# Patient Record
Sex: Female | Born: 1965 | Race: White | Hispanic: No | Marital: Single | Smoking: Never smoker
Health system: Southern US, Community
[De-identification: ages and names within clinical notes are randomized; demographics above are authoritative.]

## PROBLEM LIST (undated history)

## (undated) DIAGNOSIS — C801 Malignant (primary) neoplasm, unspecified: Secondary | ICD-10-CM

## (undated) DIAGNOSIS — F988 Other specified behavioral and emotional disorders with onset usually occurring in childhood and adolescence: Secondary | ICD-10-CM

## (undated) DIAGNOSIS — F419 Anxiety disorder, unspecified: Secondary | ICD-10-CM

## (undated) HISTORY — PX: VEIN LIGATION AND STRIPPING: SHX2653

---

## 2017-03-07 ENCOUNTER — Encounter (HOSPITAL_BASED_OUTPATIENT_CLINIC_OR_DEPARTMENT_OTHER): Payer: Self-pay | Admitting: Emergency Medicine

## 2017-03-07 ENCOUNTER — Emergency Department (HOSPITAL_BASED_OUTPATIENT_CLINIC_OR_DEPARTMENT_OTHER)
Admission: EM | Admit: 2017-03-07 | Discharge: 2017-03-07 | Disposition: A | Discharge status: Home / Self Care | Payer: Medicaid Other | Attending: Emergency Medicine | Admitting: Emergency Medicine

## 2017-03-07 ENCOUNTER — Other Ambulatory Visit: Payer: Self-pay

## 2017-03-07 DIAGNOSIS — Z79899 Other long term (current) drug therapy: Secondary | ICD-10-CM | POA: Diagnosis not present

## 2017-03-07 DIAGNOSIS — R350 Frequency of micturition: Secondary | ICD-10-CM | POA: Insufficient documentation

## 2017-03-07 DIAGNOSIS — R102 Pelvic and perineal pain: Secondary | ICD-10-CM | POA: Diagnosis present

## 2017-03-07 HISTORY — DX: Other specified behavioral and emotional disorders with onset usually occurring in childhood and adolescence: F98.8

## 2017-03-07 HISTORY — DX: Anxiety disorder, unspecified: F41.9

## 2017-03-07 LAB — WET PREP, GENITAL
SPERM: NONE SEEN
TRICH WET PREP: NONE SEEN
Yeast Wet Prep HPF POC: NONE SEEN

## 2017-03-07 LAB — URINALYSIS, MICROSCOPIC (REFLEX)

## 2017-03-07 LAB — URINALYSIS, ROUTINE W REFLEX MICROSCOPIC
Bilirubin Urine: NEGATIVE
GLUCOSE, UA: NEGATIVE mg/dL
KETONES UR: NEGATIVE mg/dL
Nitrite: NEGATIVE
PROTEIN: NEGATIVE mg/dL
Specific Gravity, Urine: 1.02 (ref 1.005–1.030)
pH: 7 (ref 5.0–8.0)

## 2017-03-07 MED ORDER — NITROFURANTOIN MONOHYD MACRO 100 MG PO CAPS: 100.0000 mg | ORAL_CAPSULE | Freq: Two times a day (BID) | ORAL | 0 refills | Status: DC

## 2017-03-07 MED FILL — NITROFURANTOIN MONO-MCR 100: 100 | 5 days supply | Qty: 10 | Fill #0

## 2017-03-07 NOTE — ED Triage Notes (Signed)
Patient states that she woke up this am with pain to her right lower pelvic region, the patient reports that the pain comes and goes and increases with movement. Reports that she has had been nauseous this am

## 2017-03-07 NOTE — ED Notes (Signed)
Urine sample and culture obtained and sent to lab.  

## 2017-03-07 NOTE — ED Notes (Signed)
Pelvic cart at bedside. 

## 2017-03-07 NOTE — ED Provider Notes (Signed)
[facility] EMERGENC[facility] DEPARTMENT Provider Note   CSN: [id number] Arrival date & time: 03/07/17  1043     History   Chief Complaint Chief Complaint  Patient presents with  . Pelvic Pain    HPI Diana Woods is a 52 y.o. female here for RLQ abdominal pain described sharp and stabbing that radiated to right low back associated with nausea starting at mid morning. Sitting walking standing made it worse, laying on right side made it feel better. Took gassex which caused resolution of pain. Pt feels like it may have been gas, she is belching and passing gas which is making her feel better.  Currently no pain. Also endorses urinary frequency and bladder fullness, but no frank dysuria. She thinks she may have a UTI. LMP January 20th. No h/o STDs or PID. she is sexually active without condom use. Not concerned about STDs today. No fevers, chills, vomiting, CP, SOB, rash, abnormal vaginal discharge or bleeding, diarrhea, melena, hematochezia.  Has h/o intermittent constipation, unchanged. Last BM yesterday. H/o c-section but no other abdominal surgeries.   HPI  Past Medical History:  Diagnosis Date  . ADD (attention deficit disorder)   . Anxiety     There are no active problems to display for this patient.   History reviewed. No pertinent surgical history.  OB History    No data available       Home Medications    Prior to Admission medications   Medication Sig Start Date End Date Taking? Authorizing Provider  amphetamine-dextroamphetamine (ADDERALL XR) 30 MG 24 hr capsule Take 30 mg by mouth daily.   Yes [provider]  sertraline (ZOLOFT) 100 MG tablet Take 100 mg by mouth daily.   Yes [provider]  nitrofurantoin, macrocrystal-monohydrate, (MACROBID) 100 MG capsule Take 1 capsule (100 mg total) by mouth 2 (two) times daily. 03/07/17   Kinnie Feil, PA-C    Family History History reviewed. No pertinent family history.  Social  History Social History   Tobacco Use  . Smoking status: Never Smoker  . Smokeless tobacco: Never Used  Substance Use Topics  . Alcohol use: No    Frequency: Never  . Drug use: No     Allergies   Patient has no known allergies.   Review of Systems Review of Systems  Gastrointestinal: Positive for abdominal pain (resolved) and nausea (resolved).  Genitourinary: Positive for frequency.       +bladder fullness     Physical Exam Updated Vital Signs BP (!) 142/96 (BP Location: Right Arm)   Pulse 80   Temp 99 F (37.2 C) (Oral)   Resp 18   Ht 5\' 6"  (1.676 m)   Wt 126.1 kg (278 lb)   LMP 02/12/2017   SpO2 100%   BMI 44.87 kg/m   Physical Exam  Constitutional: She is oriented to person, place, and time. She appears well-developed and well-nourished. No distress.  NAD.  HENT:  Head: Normocephalic and atraumatic.  Right Ear: External ear normal.  Left Ear: External ear normal.  Nose: Nose normal.  Eyes: Conjunctivae and EOM are normal. No scleral icterus.  Neck: Normal range of motion. Neck supple.  Cardiovascular: Normal rate, regular rhythm and normal heart sounds.  No murmur heard. Pulmonary/Chest: Effort normal and breath sounds normal. She has no wheezes.  Abdominal: Soft. There is no tenderness.  No guarding, rigidity, rebound. Negative Murphy's and McBurney's. No suprapubic or CVA tenderness.  Genitourinary:  Genitourinary Comments: External genitalia normal without erythema,  edema, tenderness, discharge or lesions.  No groin lymphadenopathy.  Vaginal mucosa and cervix normal, pink without discharge or lesions.  Uterus in midline, smooth, not enlarged or tender. No CMT. Non palpable adnexa. EMT and student chaperone during exam.   Musculoskeletal: Normal range of motion. She exhibits no deformity.  Neurological: She is alert and oriented to person, place, and time.  Skin: Skin is warm and dry. Capillary refill takes less than 2 seconds.  Psychiatric: She has  a normal mood and affect. Her behavior is normal. Judgment and thought content normal.  Nursing note and vitals reviewed.    ED Treatments / Results  Labs (all labs ordered are listed, but only abnormal results are displayed) Labs Reviewed  WET PREP, GENITAL - Abnormal; Notable for the following components:      Result Value   Clue Cells Wet Prep HPF POC PRESENT (*)    WBC, Wet Prep HPF POC MANY (*)    All other components within normal limits  URINALYSIS, ROUTINE W REFLEX MICROSCOPIC - Abnormal; Notable for the following components:   Hgb urine dipstick LARGE (*)    Leukocytes, UA TRACE (*)    All other components within normal limits  URINALYSIS, MICROSCOPIC (REFLEX) - Abnormal; Notable for the following components:   Bacteria, UA RARE (*)    Squamous Epithelial / LPF 0-5 (*)    All other components within normal limits  GC/CHLAMYDIA PROBE AMP ([place]) NOT AT [facility]    EKG  EKG Interpretation None       Radiology No results found.  Procedures Procedures (including critical care time)  Medications Ordered in ED Medications - No data to display   Initial Impression / Assessment and Plan / ED Course  I have reviewed the triage vital signs and the nursing notes.  Pertinent labs & imaging results that were available during my care of the patient were reviewed by me and considered in my medical decision making (see chart for details).    52 year old female here for right lower quadrant abdominal pain that radiated to the right lower back associated with nausea. Upon arrival her symptoms resolved. She had taken Gas-X PTA and had increased belching and passing gas, with resolution of symptoms. My exam is benign. No abdominal tenderness, CVA tenderness with normal pelvic exam. U/A obtained at triage with large hemoglobin and trace leukocytes.  She does have urinary frequency and bladder fullness and feels like she has a UTI. Given description of pain radiating to right low  back and hemoglobin in her urine, I recommended CT renal study to rule out kidney stone or recently passed kidney stone but she declined. She also declined lab work or CT A/P to rule out appendicitis. I think this is reasonable as she is asymptomatic currently, exam and VS WNL. She will be discharged with macrobid, pending urine culture. Advised she should take antibiotic is she is notified of positive urine culture. She declined empiric STD testing today and further work up. Gave strict ED return precautions. She is to return if RLQ pain returns or localizes, nausea, vomiting, fevers. Pt verbalized understanding and agreeable.,   Final Clinical Impressions(s) / ED Diagnoses   Final diagnoses:  Urinary frequency    ED Discharge Orders        Ordered    nitrofurantoin, macrocrystal-monohydrate, (MACROBID) 100 MG capsule  2 times daily     03/07/17 1430       Kinnie Feil, [place] 03/07/17 1438    Regenia Skeeter,  Nicki Reaper, MD 03/07/17 1556

## 2017-03-07 NOTE — Discharge Instructions (Signed)
Your urine had some hemoglobin (blood in it). As we discussed this may have been from a recently passed stone or early urinary tract infection. You declined CT imaging today to rule out kidney stone, kidney infection and appendicitis because your pain had resolved.   You have a prescription for antibiotics for early urinary tract infection. Even though your urine did not look infected today, it was sent for culture to rule out an early infection. You will be called in 1-2 days if it is positive, if it is you should start taking the antibiotics. Your gonorrhea and chlamydia testing is pending, again, you will be called in 2 days if these are positive.   Return if you pain return, you have nausea, vomiting, fevers, chills, abnormal vaginal discharge and bleeding.

## 2017-03-08 LAB — GC/CHLAMYDIA PROBE AMP (~~LOC~~) NOT AT ARMC
Chlamydia: NEGATIVE
NEISSERIA GONORRHEA: NEGATIVE

## 2018-04-22 ENCOUNTER — Other Ambulatory Visit: Payer: Self-pay

## 2018-04-22 ENCOUNTER — Encounter (HOSPITAL_BASED_OUTPATIENT_CLINIC_OR_DEPARTMENT_OTHER): Payer: Self-pay | Admitting: *Deleted

## 2018-04-22 ENCOUNTER — Observation Stay (HOSPITAL_COMMUNITY): Payer: Medicaid Other | Admitting: Anesthesiology

## 2018-04-22 ENCOUNTER — Emergency Department (HOSPITAL_BASED_OUTPATIENT_CLINIC_OR_DEPARTMENT_OTHER): Payer: Medicaid Other

## 2018-04-22 ENCOUNTER — Ambulatory Visit (HOSPITAL_BASED_OUTPATIENT_CLINIC_OR_DEPARTMENT_OTHER)
Admission: EM | Admit: 2018-04-22 | Discharge: 2018-04-23 | Disposition: A | Discharge status: Home / Self Care | Payer: Medicaid Other | Attending: General Surgery | Admitting: General Surgery

## 2018-04-22 ENCOUNTER — Encounter (HOSPITAL_COMMUNITY)
Admission: EM | Disposition: A | Discharge status: Home / Self Care | Payer: Self-pay | Source: Home / Self Care | Attending: Emergency Medicine

## 2018-04-22 DIAGNOSIS — Z79899 Other long term (current) drug therapy: Secondary | ICD-10-CM | POA: Insufficient documentation

## 2018-04-22 DIAGNOSIS — K3580 Unspecified acute appendicitis: Secondary | ICD-10-CM

## 2018-04-22 DIAGNOSIS — K37 Unspecified appendicitis: Secondary | ICD-10-CM | POA: Diagnosis present

## 2018-04-22 DIAGNOSIS — F988 Other specified behavioral and emotional disorders with onset usually occurring in childhood and adolescence: Secondary | ICD-10-CM | POA: Diagnosis not present

## 2018-04-22 DIAGNOSIS — F419 Anxiety disorder, unspecified: Secondary | ICD-10-CM | POA: Diagnosis not present

## 2018-04-22 DIAGNOSIS — K3533 Acute appendicitis with perforation and localized peritonitis, with abscess: Secondary | ICD-10-CM | POA: Insufficient documentation

## 2018-04-22 DIAGNOSIS — Z6841 Body Mass Index (BMI) 40.0 and over, adult: Secondary | ICD-10-CM | POA: Diagnosis not present

## 2018-04-22 HISTORY — PX: LAPAROSCOPIC APPENDECTOMY: SHX408

## 2018-04-22 HISTORY — DX: Malignant (primary) neoplasm, unspecified: C80.1

## 2018-04-22 LAB — CBC WITH DIFFERENTIAL/PLATELET
ABS IMMATURE GRANULOCYTES: 0.03 10*3/uL (ref 0.00–0.07)
BASOS ABS: 0 10*3/uL (ref 0.0–0.1)
Basophils Relative: 0 %
Eosinophils Absolute: 0 10*3/uL (ref 0.0–0.5)
Eosinophils Relative: 0 %
HCT: 40.8 % (ref 36.0–46.0)
HEMOGLOBIN: 13.2 g/dL (ref 12.0–15.0)
Immature Granulocytes: 0 %
LYMPHS ABS: 1.3 10*3/uL (ref 0.7–4.0)
Lymphocytes Relative: 11 %
MCH: 28.9 pg (ref 26.0–34.0)
MCHC: 32.4 g/dL (ref 30.0–36.0)
MCV: 89.3 fL (ref 80.0–100.0)
Monocytes Absolute: 0.4 10*3/uL (ref 0.1–1.0)
Monocytes Relative: 4 %
NEUTROS PCT: 85 %
Neutro Abs: 9.8 10*3/uL — ABNORMAL HIGH (ref 1.7–7.7)
Platelets: 263 10*3/uL (ref 150–400)
RBC: 4.57 MIL/uL (ref 3.87–5.11)
RDW: 12.8 % (ref 11.5–15.5)
WBC: 11.5 10*3/uL — ABNORMAL HIGH (ref 4.0–10.5)
nRBC: 0 % (ref 0.0–0.2)

## 2018-04-22 LAB — COMPREHENSIVE METABOLIC PANEL
ALBUMIN: 4 g/dL (ref 3.5–5.0)
ALK PHOS: 72 U/L (ref 38–126)
ALT: 16 U/L (ref 0–44)
ANION GAP: 8 (ref 5–15)
AST: 19 U/L (ref 15–41)
BUN: 12 mg/dL (ref 6–20)
CALCIUM: 9.4 mg/dL (ref 8.9–10.3)
CO2: 27 mmol/L (ref 22–32)
Chloride: 101 mmol/L (ref 98–111)
Creatinine, Ser: 0.74 mg/dL (ref 0.44–1.00)
GFR calc Af Amer: >60 mL/min (ref >60)
GFR calc non Af Amer: >60 mL/min (ref >60)
GLUCOSE: 139 mg/dL — AB (ref 70–99)
Potassium: 4.4 mmol/L (ref 3.5–5.1)
SODIUM: 136 mmol/L (ref 135–145)
Total Bilirubin: 0.9 mg/dL (ref 0.3–1.2)
Total Protein: 7.8 g/dL (ref 6.5–8.1)

## 2018-04-22 LAB — SURGICAL PCR SCREEN
MRSA, PCR: NEGATIVE
STAPHYLOCOCCUS AUREUS: NEGATIVE

## 2018-04-22 SURGERY — APPENDECTOMY, LAPAROSCOPIC
Anesthesia: General | Site: Abdomen

## 2018-04-22 MED ORDER — EPHEDRINE 5 MG/ML INJ
INTRAVENOUS | Status: AC
  Filled 2018-04-22: qty 10

## 2018-04-22 MED ORDER — BUPIVACAINE-EPINEPHRINE (PF) 0.25% -1:200000 IJ SOLN
INTRAMUSCULAR | Status: AC
  Filled 2018-04-22: qty 30

## 2018-04-22 MED ORDER — HEPARIN SODIUM (PORCINE) 5000 UNIT/ML IJ SOLN
5000.0000 [IU] | Freq: Three times a day (TID) | INTRAMUSCULAR | Status: DC
  Administered 2018-04-23: 5000 [IU] via SUBCUTANEOUS
  Filled 2018-04-22: qty 1

## 2018-04-22 MED ORDER — MORPHINE SULFATE (PF) 4 MG/ML IV SOLN
4.0000 mg | Freq: Once | INTRAVENOUS | Status: AC
  Administered 2018-04-22: 4 mg via INTRAVENOUS
  Filled 2018-04-22: qty 1

## 2018-04-22 MED ORDER — FENTANYL CITRATE (PF) 100 MCG/2ML IJ SOLN
INTRAMUSCULAR | Status: DC | PRN
  Administered 2018-04-22: 25 ug via INTRAVENOUS
  Administered 2018-04-22 (×2): 50 ug via INTRAVENOUS
  Administered 2018-04-22: 25 ug via INTRAVENOUS
  Administered 2018-04-22 (×3): 50 ug via INTRAVENOUS
  Administered 2018-04-22 (×2): 25 ug via INTRAVENOUS

## 2018-04-22 MED ORDER — 0.9 % SODIUM CHLORIDE (POUR BTL) OPTIME
TOPICAL | Status: DC | PRN
  Administered 2018-04-22: 1000 mL

## 2018-04-22 MED ORDER — MIDAZOLAM HCL 2 MG/2ML IJ SOLN
INTRAMUSCULAR | Status: AC
  Filled 2018-04-22: qty 2

## 2018-04-22 MED ORDER — IOHEXOL 300 MG/ML  SOLN
100.0000 mL | Freq: Once | INTRAMUSCULAR | Status: AC | PRN
  Administered 2018-04-22: 100 mL via INTRAVENOUS

## 2018-04-22 MED ORDER — ONDANSETRON HCL 4 MG/2ML IJ SOLN
INTRAMUSCULAR | Status: DC | PRN
  Administered 2018-04-22: 4 mg via INTRAVENOUS

## 2018-04-22 MED ORDER — LACTATED RINGERS IV SOLN
INTRAVENOUS | Status: DC | PRN
  Administered 2018-04-22 (×2): via INTRAVENOUS

## 2018-04-22 MED ORDER — SUCCINYLCHOLINE CHLORIDE 20 MG/ML IJ SOLN
INTRAMUSCULAR | Status: DC | PRN
  Administered 2018-04-22: 120 mg via INTRAVENOUS

## 2018-04-22 MED ORDER — AMPHETAMINE-DEXTROAMPHET ER 10 MG PO CP24
30.0000 mg | ORAL_CAPSULE | Freq: Every day | ORAL | Status: DC
  Administered 2018-04-23: 30 mg via ORAL
  Filled 2018-04-22: qty 3

## 2018-04-22 MED ORDER — EPHEDRINE SULFATE 50 MG/ML IJ SOLN
INTRAMUSCULAR | Status: DC | PRN
  Administered 2018-04-22: 15 mg via INTRAVENOUS
  Administered 2018-04-22 (×2): 10 mg via INTRAVENOUS

## 2018-04-22 MED ORDER — ONDANSETRON HCL 4 MG/2ML IJ SOLN
INTRAMUSCULAR | Status: AC
  Filled 2018-04-22: qty 2

## 2018-04-22 MED ORDER — BUPIVACAINE-EPINEPHRINE 0.25% -1:200000 IJ SOLN
INTRAMUSCULAR | Status: DC | PRN
  Administered 2018-04-22: 18 mL

## 2018-04-22 MED ORDER — LIDOCAINE 2% (20 MG/ML) 5 ML SYRINGE
INTRAMUSCULAR | Status: AC
  Filled 2018-04-22: qty 5

## 2018-04-22 MED ORDER — SODIUM CHLORIDE 0.9 % IV SOLN
INTRAVENOUS | Status: AC
  Administered 2018-04-22: 12:00:00 via INTRAVENOUS

## 2018-04-22 MED ORDER — PHENYLEPHRINE HCL 10 MG/ML IJ SOLN
INTRAMUSCULAR | Status: DC | PRN
  Administered 2018-04-22: 120 ug via INTRAVENOUS

## 2018-04-22 MED ORDER — ROCURONIUM BROMIDE 100 MG/10ML IV SOLN
INTRAVENOUS | Status: DC | PRN
  Administered 2018-04-22: 25 mg via INTRAVENOUS
  Administered 2018-04-22: 5 mg via INTRAVENOUS
  Administered 2018-04-22: 25 mg via INTRAVENOUS

## 2018-04-22 MED ORDER — SUGAMMADEX SODIUM 200 MG/2ML IV SOLN
INTRAVENOUS | Status: DC | PRN
  Administered 2018-04-22: 300 mg via INTRAVENOUS

## 2018-04-22 MED ORDER — ALBUMIN HUMAN 5 % IV SOLN
INTRAVENOUS | Status: AC
  Filled 2018-04-22: qty 250

## 2018-04-22 MED ORDER — FENTANYL CITRATE (PF) 250 MCG/5ML IJ SOLN
INTRAMUSCULAR | Status: AC
  Filled 2018-04-22: qty 5

## 2018-04-22 MED ORDER — HYDROCODONE-ACETAMINOPHEN 5-325 MG PO TABS
1.0000 | ORAL_TABLET | ORAL | Status: DC | PRN
  Administered 2018-04-23 (×2): 2 via ORAL
  Filled 2018-04-22 (×2): qty 2

## 2018-04-22 MED ORDER — GLYCOPYRROLATE 0.2 MG/ML IJ SOLN
INTRAMUSCULAR | Status: DC | PRN
  Administered 2018-04-22: 0.4 mg via INTRAVENOUS

## 2018-04-22 MED ORDER — ONDANSETRON HCL 4 MG/2ML IJ SOLN: 4.0000 mg | Freq: Four times a day (QID) | INTRAMUSCULAR | Status: DC | PRN

## 2018-04-22 MED ORDER — DEXAMETHASONE SODIUM PHOSPHATE 10 MG/ML IJ SOLN
INTRAMUSCULAR | Status: DC | PRN
  Administered 2018-04-22: 5 mg via INTRAVENOUS

## 2018-04-22 MED ORDER — PHENYLEPHRINE 40 MCG/ML (10ML) SYRINGE FOR IV PUSH (FOR BLOOD PRESSURE SUPPORT)
PREFILLED_SYRINGE | INTRAVENOUS | Status: AC
  Filled 2018-04-22: qty 10

## 2018-04-22 MED ORDER — ONDANSETRON HCL 4 MG/2ML IJ SOLN
4.0000 mg | Freq: Once | INTRAMUSCULAR | Status: DC
  Filled 2018-04-22: qty 2

## 2018-04-22 MED ORDER — FAMOTIDINE 20 MG PO TABS
20.0000 mg | ORAL_TABLET | Freq: Once | ORAL | Status: AC
  Administered 2018-04-22: 20 mg via ORAL
  Filled 2018-04-22: qty 1

## 2018-04-22 MED ORDER — PROPOFOL 10 MG/ML IV BOLUS
INTRAVENOUS | Status: AC
  Filled 2018-04-22: qty 20

## 2018-04-22 MED ORDER — MORPHINE SULFATE (PF) 4 MG/ML IV SOLN
4.0000 mg | INTRAVENOUS | Status: DC | PRN
Start: 2018-04-22 — End: 2018-04-22

## 2018-04-22 MED ORDER — ONDANSETRON HCL 4 MG/2ML IJ SOLN: 4.0000 mg | Freq: Once | INTRAMUSCULAR | Status: DC | PRN

## 2018-04-22 MED ORDER — MORPHINE SULFATE (PF) 2 MG/ML IV SOLN
1.0000 mg | INTRAVENOUS | Status: DC | PRN
  Administered 2018-04-22: 1 mg via INTRAVENOUS
  Filled 2018-04-22: qty 1

## 2018-04-22 MED ORDER — FENTANYL CITRATE (PF) 100 MCG/2ML IJ SOLN: 25.0000 ug | INTRAMUSCULAR | Status: DC | PRN

## 2018-04-22 MED ORDER — METRONIDAZOLE IN NACL 5-0.79 MG/ML-% IV SOLN
500.0000 mg | Freq: Once | INTRAVENOUS | Status: AC
  Administered 2018-04-22: 500 mg via INTRAVENOUS
  Filled 2018-04-22: qty 100

## 2018-04-22 MED ORDER — FENTANYL CITRATE (PF) 100 MCG/2ML IJ SOLN
INTRAMUSCULAR | Status: AC
  Filled 2018-04-22: qty 2

## 2018-04-22 MED ORDER — DEXAMETHASONE SODIUM PHOSPHATE 10 MG/ML IJ SOLN
INTRAMUSCULAR | Status: AC
  Filled 2018-04-22: qty 1

## 2018-04-22 MED ORDER — MIDAZOLAM HCL 5 MG/5ML IJ SOLN
INTRAMUSCULAR | Status: DC | PRN
  Administered 2018-04-22: 2 mg via INTRAVENOUS

## 2018-04-22 MED ORDER — LIDOCAINE HCL (CARDIAC) PF 100 MG/5ML IV SOSY
PREFILLED_SYRINGE | INTRAVENOUS | Status: DC | PRN
  Administered 2018-04-22: 50 mg via INTRAVENOUS

## 2018-04-22 MED ORDER — PANTOPRAZOLE SODIUM 40 MG IV SOLR
40.0000 mg | Freq: Every day | INTRAVENOUS | Status: DC
  Administered 2018-04-22: 40 mg via INTRAVENOUS
  Filled 2018-04-22: qty 40

## 2018-04-22 MED ORDER — GLYCOPYRROLATE PF 0.2 MG/ML IJ SOSY
PREFILLED_SYRINGE | INTRAMUSCULAR | Status: AC
  Filled 2018-04-22: qty 2

## 2018-04-22 MED ORDER — ROCURONIUM BROMIDE 100 MG/10ML IV SOLN
INTRAVENOUS | Status: AC
  Filled 2018-04-22: qty 1

## 2018-04-22 MED ORDER — ETOMIDATE 2 MG/ML IV SOLN
INTRAVENOUS | Status: AC
  Filled 2018-04-22: qty 10

## 2018-04-22 MED ORDER — ONDANSETRON 4 MG PO TBDP
4.0000 mg | ORAL_TABLET | Freq: Once | ORAL | Status: AC
  Administered 2018-04-22: 4 mg via ORAL
  Filled 2018-04-22: qty 1

## 2018-04-22 MED ORDER — LACTATED RINGERS IR SOLN
Status: DC | PRN
  Administered 2018-04-22: 1000 mL

## 2018-04-22 MED ORDER — VASOPRESSIN 20 UNIT/ML IV SOLN
INTRAVENOUS | Status: AC
  Filled 2018-04-22: qty 1

## 2018-04-22 MED ORDER — SERTRALINE HCL 100 MG PO TABS
100.0000 mg | ORAL_TABLET | Freq: Every day | ORAL | Status: DC
  Administered 2018-04-23: 100 mg via ORAL
  Filled 2018-04-22: qty 1

## 2018-04-22 MED ORDER — ONDANSETRON 4 MG PO TBDP: 4.0000 mg | ORAL_TABLET | Freq: Four times a day (QID) | ORAL | Status: DC | PRN

## 2018-04-22 MED ORDER — KCL IN DEXTROSE-NACL 20-5-0.9 MEQ/L-%-% IV SOLN
INTRAVENOUS | Status: DC
  Administered 2018-04-22: 18:00:00 via INTRAVENOUS
  Filled 2018-04-22 (×2): qty 1000

## 2018-04-22 MED ORDER — SUCCINYLCHOLINE CHLORIDE 200 MG/10ML IV SOSY
PREFILLED_SYRINGE | INTRAVENOUS | Status: AC
  Filled 2018-04-22: qty 10

## 2018-04-22 MED ORDER — MORPHINE SULFATE (PF) 4 MG/ML IV SOLN
4.0000 mg | INTRAVENOUS | Status: DC | PRN
  Administered 2018-04-22: 4 mg via INTRAVENOUS
  Filled 2018-04-22: qty 1

## 2018-04-22 MED ORDER — CEFTRIAXONE SODIUM 2 G IJ SOLR
2.0000 g | Freq: Once | INTRAMUSCULAR | Status: AC
  Administered 2018-04-22: 2 g via INTRAVENOUS
  Filled 2018-04-22: qty 20

## 2018-04-22 MED ORDER — PROPOFOL 10 MG/ML IV BOLUS
INTRAVENOUS | Status: DC | PRN
  Administered 2018-04-22: 180 mg via INTRAVENOUS

## 2018-04-22 SURGICAL SUPPLY — 31 items
APPLIER CLIP ROT 10 11.4 M/L (STAPLE)
CABLE HIGH FREQUENCY MONO STRZ (ELECTRODE) ×3 IMPLANT
CHLORAPREP W/TINT 26 (MISCELLANEOUS) ×3 IMPLANT
CLIP APPLIE ROT 10 11.4 M/L (STAPLE) IMPLANT
COVER WAND RF STERILE (DRAPES) IMPLANT
CUTTER FLEX LINEAR 45M (STAPLE) ×3 IMPLANT
DECANTER SPIKE VIAL GLASS SM (MISCELLANEOUS) ×3 IMPLANT
DERMABOND ADVANCED (GAUZE/BANDAGES/DRESSINGS) ×2
DERMABOND ADVANCED .7 DNX12 (GAUZE/BANDAGES/DRESSINGS) ×1 IMPLANT
ELECT REM PT RETURN 15FT ADLT (MISCELLANEOUS) ×3 IMPLANT
ENDOLOOP SUT PDS II  0 18 (SUTURE)
ENDOLOOP SUT PDS II 0 18 (SUTURE) IMPLANT
GLOVE BIO SURGEON STRL SZ7.5 (GLOVE) ×3 IMPLANT
GOWN STRL REUS W/TWL XL LVL3 (GOWN DISPOSABLE) ×6 IMPLANT
KIT BASIN OR (CUSTOM PROCEDURE TRAY) ×3 IMPLANT
KIT TURNOVER KIT A (KITS) IMPLANT
POUCH RETRIEVAL ECOSAC 10 (ENDOMECHANICALS) ×1 IMPLANT
POUCH RETRIEVAL ECOSAC 10MM (ENDOMECHANICALS) ×2
POUCH SPECIMEN RETRIEVAL 10MM (ENDOMECHANICALS) IMPLANT
RELOAD 45 VASCULAR/THIN (ENDOMECHANICALS) IMPLANT
RELOAD STAPLE TA45 3.5 REG BLU (ENDOMECHANICALS) ×3 IMPLANT
SCISSORS LAP 5X35 DISP (ENDOMECHANICALS) ×3 IMPLANT
SET IRRIG TUBING LAPAROSCOPIC (IRRIGATION / IRRIGATOR) ×3 IMPLANT
SET TUBE SMOKE EVAC HIGH FLOW (TUBING) ×3 IMPLANT
SHEARS HARMONIC ACE PLUS 36CM (ENDOMECHANICALS) ×3 IMPLANT
SUT MNCRL AB 4-0 PS2 18 (SUTURE) ×3 IMPLANT
TOWEL OR 17X26 10 PK STRL BLUE (TOWEL DISPOSABLE) ×3 IMPLANT
TRAY FOLEY MTR SLVR 16FR STAT (SET/KITS/TRAYS/PACK) IMPLANT
TRAY LAPAROSCOPIC (CUSTOM PROCEDURE TRAY) ×3 IMPLANT
TROCAR BLADELESS OPT 5 100 (ENDOMECHANICALS) ×3 IMPLANT
TROCAR XCEL BLUNT TIP 100MML (ENDOMECHANICALS) ×3 IMPLANT

## 2018-04-22 NOTE — Op Note (Signed)
04/22/2018  4:41 PM  PATIENT:  Diana Woods  53 y.o. female  PRE-OPERATIVE DIAGNOSIS:  appendicits  POST-OPERATIVE DIAGNOSIS:  appendicits  PROCEDURE:  Procedure(s): APPENDECTOMY LAPAROSCOPIC (N/A)  SURGEON:  Surgeon(s) and Role:    * Jovita Kussmaul, MD - Primary  PHYSICIAN ASSISTANT:   ASSISTANTS: none   ANESTHESIA:   local and general  EBL:  10 mL   BLOOD ADMINISTERED:none  DRAINS: none   LOCAL MEDICATIONS USED:  MARCAINE     SPECIMEN:  Source of Specimen:  appendix  DISPOSITION OF SPECIMEN:  PATHOLOGY  COUNTS:  YES  TOURNIQUET:  * No tourniquets in log *  DICTATION: .Dragon Dictation   After informed consent was obtained patient was brought to the operating room placed in the supine position on the operating room table. After adequate induction of general anesthesia the patient's abdomen was prepped with ChloraPrep, allowed to dry, and draped in usual sterile manner. The area below the umbilicus was infiltrated with quarter percent Marcaine. A small incision was made with a 15 blade knife. This incision was carried down through the subcutaneous tissue bluntly with a hemostat and Army-Navy retractors until the linea alba was identified. The linea alba was incised with a 15 blade knife. Each side was grasped Coker clamps and elevated anteriorly. The preperitoneal space was probed bluntly with a hemostat until the peritoneum was opened and access was gained to the abdominal cavity. A 0 Vicryl purse string stitch was placed in the fascia surrounding the opening. A Hassan cannula was placed through the opening and anchored in place with the previously placed Vicryl purse string stitch. The laparoscope was placed through the [facility] cannula. The abdomen was insufflated with carbon dioxide without difficulty. Next the suprapubic area was infiltrated with quarter percent Marcaine. A small incision was made with a 15 blade knife. A 5 mm port was placed bluntly through this incision into  the abdominal cavity. A site was then chosen in the left upper quadrant for placement of a 5 mm port. The area was infiltrated with quarter percent Marcaine. A small stab incision was made with a 15 blade knife. A 5 mm port was placed bluntly through this incision and the abdominal cavity under direct vision. The laparoscope was then moved to the suprapubic port. Using a Glassman grasper and harmonic scalpel the right lower quadrant was inspected. The appendix was readily identified. The appendix was elevated anteriorly and the mesoappendix was taken down sharply with the harmonic scalpel. Once the base of the appendix where it joined the cecum was identified and cleared of any tissue then a laparoscopic GIA blue load 6 row stapler was placed through the [facility] cannula. The stapler was placed across the base of the appendix clamped and fired thereby dividing the base of the appendix between staple lines. A laparoscopic bag was then inserted through the [facility] cannula. The appendix was placed within the bag and the bag was sealed. The abdomen was then irrigated with copious amounts of saline until the effluent was clear. No other abnormalities were noted. The appendix and bag were removed with the [facility] cannula through the infraumbilical port without difficulty. The fascial defect was closed with the previously placed Vicryl pursestring stitch as well as with another interrupted 0 Vicryl figure-of-eight stitch. The rest of the ports were removed under direct vision and were found to be hemostatic. The gas was allowed to escape. The skin incisions were closed with interrupted 4-0 Monocryl subcuticular stitches. Dermabond dressings were applied. The  patient tolerated the procedure well. At the end of the case all needle sponge and instrument counts were correct. The patient was then awakened and taken to recovery in stable condition.  PLAN OF CARE: Admit for overnight observation  PATIENT DISPOSITION:  PACU -  hemodynamically stable.   Delay start of Pharmacological VTE agent (>24hrs) due to surgical blood loss or risk of bleeding: no

## 2018-04-22 NOTE — Anesthesia Preprocedure Evaluation (Signed)
Anesthesia Evaluation  Patient identified by MRN, date of birth, ID band Patient awake    Reviewed: Allergy & Precautions, NPO status , Patient's Chart, lab work & pertinent test results  Airway Mallampati: II  TM Distance: >3 FB Neck ROM: Full    Dental  (+) Dental Advisory Given   Pulmonary neg pulmonary ROS,    breath sounds clear to auscultation       Cardiovascular negative cardio ROS   Rhythm:Regular Rate:Normal     Neuro/Psych Anxiety negative neurological ROS     GI/Hepatic Neg liver ROS, Acute appendicitis   Endo/Other  Morbid obesity  Renal/GU negative Renal ROS     Musculoskeletal   Abdominal   Peds  Hematology negative hematology ROS (+)   Anesthesia Other Findings   Reproductive/Obstetrics                             Lab Results  Component Value Date   WBC 11.5 (H) 04/22/2018   HGB 13.2 04/22/2018   HCT 40.8 04/22/2018   MCV 89.3 04/22/2018   PLT 263 04/22/2018   Lab Results  Component Value Date   CREATININE 0.74 04/22/2018   BUN 12 04/22/2018   NA 136 04/22/2018   K 4.4 04/22/2018   CL 101 04/22/2018   CO2 27 04/22/2018    Anesthesia Physical Anesthesia Plan  ASA: III and emergent  Anesthesia Plan: General   Post-op Pain Management:    Induction: Intravenous and Rapid sequence  PONV Risk Score and Plan: 3 and Dexamethasone, Ondansetron and Treatment may vary due to age or medical condition  Airway Management Planned: Oral ETT  Additional Equipment:   Intra-op Plan:   Post-operative Plan: Extubation in OR  Informed Consent: I have reviewed the patients History and Physical, chart, labs and discussed the procedure including the risks, benefits and alternatives for the proposed anesthesia with the patient or authorized representative who has indicated his/her understanding and acceptance.     Dental advisory given  Plan Discussed with:  CRNA  Anesthesia Plan Comments:         Anesthesia Quick Evaluation

## 2018-04-22 NOTE — Anesthesia Procedure Notes (Signed)
Procedure Name: Intubation Date/Time: 04/22/2018 3:24 PM Performed by: Garrel Ridgel, CRNA Pre-anesthesia Checklist: Patient identified, Emergency Drugs available, Suction available, Patient being monitored and Timeout performed Patient Re-evaluated:Patient Re-evaluated prior to induction Oxygen Delivery Method: Circle system utilized Preoxygenation: Pre-oxygenation with 100% oxygen Induction Type: IV induction, Cricoid Pressure applied and Rapid sequence Laryngoscope Size: Mac and 3 Grade View: Grade II Tube size: 7.0 mm Number of attempts: 1 Airway Equipment and Method: Stylet Placement Confirmation: ETT inserted through vocal cords under direct vision,  positive ETCO2 and breath sounds checked- equal and bilateral Secured at: 22 cm Tube secured with: Tape Dental Injury: Teeth and Oropharynx as per pre-operative assessment

## 2018-04-22 NOTE — ED Triage Notes (Signed)
Pain started at 2300 to left side of her abdomen.  Denies vomiting but has been having dry heaves.

## 2018-04-22 NOTE — ED Provider Notes (Signed)
[facility] EMERGENCY DEPARTMENT Provider Note   CSN: [id number] Arrival date & time: 04/22/18  3086    History   Chief Complaint Chief Complaint  Patient presents with  . Abdominal Pain    HPI Diana Woods is a 53 y.o. female who presents to ED for left-sided abdominal pain for the past 12 hours.  States that pain began after she ate a sandwich.  States that it is sharp, no specific aggravating or alleviating factor.  No improvement with Gas-X pills.  She initially thought it was due to gas but she has been passing gas and belching but continues to have the pain.  She reports dry heaving, nausea and a loose bowel movement this morning.  She denies any urinary symptoms, vaginal complaints, suspicious food ingestions, fevers.  Prior abdominal surgeries include C-section.     HPI  Past Medical History:  Diagnosis Date  . ADD (attention deficit disorder)   . Anxiety   . Cancer [facility])     Patient Active Problem List   Diagnosis Date Noted  . Appendicitis 04/22/2018    Past Surgical History:  Procedure Laterality Date  . CESAREAN SECTION    . VEIN LIGATION AND STRIPPING       OB History   No obstetric history on file.      Home Medications    Prior to Admission medications   Medication Sig Start Date End Date Taking? Authorizing Provider  amphetamine-dextroamphetamine (ADDERALL XR) 30 MG 24 hr capsule Take 30 mg by mouth daily.   Yes [provider]  sertraline (ZOLOFT) 100 MG tablet Take 100 mg by mouth daily.   Yes [provider]  nitrofurantoin, macrocrystal-monohydrate, (MACROBID) 100 MG capsule Take 1 capsule (100 mg total) by mouth 2 (two) times daily. 03/07/17   Kinnie Feil, PA-C    Family History Family History  Problem Relation Age of Onset  . Cancer Mother     Social History Social History   Tobacco Use  . Smoking status: Never Smoker  . Smokeless tobacco: Never Used  Substance Use Topics  . Alcohol use: Yes   Frequency: Never    Comment: occasionally  . Drug use: No     Allergies   Patient has no known allergies.   Review of Systems Review of Systems  Constitutional: Negative for appetite change, chills and fever.  HENT: Negative for ear pain, rhinorrhea, sneezing and sore throat.   Eyes: Negative for photophobia and visual disturbance.  Respiratory: Negative for cough, chest tightness, shortness of breath and wheezing.   Cardiovascular: Negative for chest pain and palpitations.  Gastrointestinal: Positive for abdominal pain and nausea. Negative for blood in stool, constipation, diarrhea and vomiting.  Genitourinary: Negative for dysuria, hematuria and urgency.  Musculoskeletal: Negative for myalgias.  Skin: Negative for rash.  Neurological: Negative for dizziness, weakness and light-headedness.     Physical Exam Updated Vital Signs BP (!) 145/71 (BP Location: Right Arm)   Pulse 73   Temp 98.4 F (36.9 C) (Oral)   Resp 16   Ht 5\' 5"  (1.651 m)   Wt 124.7 kg   LMP 03/24/2018   SpO2 93%   BMI 45.76 kg/m   Physical Exam Vitals signs and nursing note reviewed.  Constitutional:      General: She is not in acute distress.    Appearance: She is well-developed.  HENT:     Head: Normocephalic and atraumatic.     Nose: Nose normal.  Eyes:  General: No scleral icterus.       Left eye: No discharge.     Conjunctiva/sclera: Conjunctivae normal.  Neck:     Musculoskeletal: Normal range of motion and neck supple.  Cardiovascular:     Rate and Rhythm: Normal rate and regular rhythm.     Heart sounds: Normal heart sounds. No murmur. No friction rub. No gallop.   Pulmonary:     Effort: Pulmonary effort is normal. No respiratory distress.     Breath sounds: Normal breath sounds.  Abdominal:     General: Bowel sounds are normal. There is no distension.     Palpations: Abdomen is soft.     Tenderness: There is abdominal tenderness (L sided). There is no guarding.     Musculoskeletal: Normal range of motion.  Skin:    General: Skin is warm and dry.     Findings: No rash.  Neurological:     Mental Status: She is alert.     Motor: No abnormal muscle tone.     Coordination: Coordination normal.      ED Treatments / Results  Labs (all labs ordered are listed, but only abnormal results are displayed) Labs Reviewed  COMPREHENSIVE METABOLIC PANEL - Abnormal; Notable for the following components:      Result Value   Glucose, Bld 139 (*)    All other components within normal limits  CBC WITH DIFFERENTIAL/PLATELET - Abnormal; Notable for the following components:   WBC 11.5 (*)    Neutro Abs 9.8 (*)    All other components within normal limits    EKG None  Radiology Ct Abdomen Pelvis W Contrast  Result Date: 04/22/2018 CLINICAL DATA:  Left lower quadrant abdominal pain, nausea/vomiting EXAM: CT ABDOMEN AND PELVIS WITH CONTRAST TECHNIQUE: Multidetector CT imaging of the abdomen and pelvis was performed using the standard protocol following bolus administration of intravenous contrast. CONTRAST:  182mL OMNIPAQUE IOHEXOL 300 MG/ML  SOLN COMPARISON:  None. FINDINGS: Lower chest: Lung bases are clear. Hepatobiliary: Liver is within normal limits. Gallbladder is unremarkable. No intrahepatic or extrahepatic ductal dilatation. Pancreas: Within normal limits. Spleen: Within normal limits. Adrenals/Urinary Tract: Adrenal glands are within normal limits. Kidneys are within normal limits.  No hydronephrosis. Bladder is within normal limits. Stomach/Bowel: Stomach is within normal limits. No evidence of bowel obstruction. Dilated distal appendix, measuring up to 14 mm (series 2/image 37). Proximal appendicolith (series 2/image 42), with adjacent inflammatory changes along the mid appendix (series 2/image 39). These findings are compatible with uncomplicated acute appendicitis. No evidence of perforation. No drainable fluid collection/abscess. Mild left colonic  diverticulosis, without evidence of diverticulitis. Vascular/Lymphatic: No evidence of abdominal aortic aneurysm. No suspicious abdominopelvic lymphadenopathy. Reproductive: Uterus is within normal limits. No adnexal masses. Other: No abdominopelvic ascites. Musculoskeletal: Degenerative changes of the visualized thoracolumbar spine. IMPRESSION: Acute appendicitis, as described above. Proximal appendicolith. No evidence of perforation. Electronically Signed   By: Julian Hy M.D.   On: 04/22/2018 11:57    Procedures Procedures (including critical care time)  Medications Ordered in ED Medications  0.9 %  sodium chloride infusion (has no administration in time range)  cefTRIAXone (ROCEPHIN) 2 g in sodium chloride 0.9 % 100 mL IVPB (has no administration in time range)    And  metroNIDAZOLE (FLAGYL) IVPB 500 mg (has no administration in time range)  famotidine (PEPCID) tablet 20 mg (20 mg Oral Given 04/22/18 1048)  ondansetron (ZOFRAN-ODT) disintegrating tablet 4 mg (4 mg Oral Given 04/22/18 1048)  morphine 4 MG/ML injection  4 mg (4 mg Intravenous Given 04/22/18 1059)  iohexol (OMNIPAQUE) 300 MG/ML solution 100 mL (100 mLs Intravenous Contrast Given 04/22/18 1127)     Initial Impression / Assessment and Plan / ED Course  I have reviewed the triage vital signs and the nursing notes.  Pertinent labs & imaging results that were available during my care of the patient were reviewed by me and considered in my medical decision making (see chart for details).         53 year old female presents to ED for left-sided abdominal pain for the past 12 hours.  No improvement with her home anti-gas pills.  She reports nausea, dry heaving and some looser bowel movements.  She denies urinary symptoms, vaginal complaints or fever.  On my exam there is tenderness to palpation of the left side of abdomen without rebound or guarding.  Vital signs are within normal limits.  CBC with leukocytosis of 11. CT shows  acute appendicitis without perforation. Will transfer to [facility] for surgical evaluation.   Final Clinical Impressions(s) / ED Diagnoses   Final diagnoses:  Acute appendicitis, unspecified acute appendicitis type    ED Discharge Orders    None      Portions of this note were generated with Dragon dictation software. Dictation errors may occur despite best attempts at proofreading.    Delia Heady, PA-C 04/22/18 1215    Gareth Morgan, MD 04/22/18 [phone]

## 2018-04-22 NOTE — H&P (Signed)
Diana Woods is an 53 y.o. female.   Chief Complaint: abd pain HPI: The patient is a 53 year old white female who presents with abd pain. It started at 11pm last night. It has been associated with nausea and vomiting. She denies fever. CT shows appendicitis  Past Medical History:  Diagnosis Date  . ADD (attention deficit disorder)   . Anxiety   . Cancer [facility])     Past Surgical History:  Procedure Laterality Date  . CESAREAN SECTION    . VEIN LIGATION AND STRIPPING      Family History  Problem Relation Age of Onset  . Cancer Mother    Social History:  reports that she has never smoked. She has never used smokeless tobacco. She reports current alcohol use. She reports that she does not use drugs.  Allergies: No Known Allergies  Medications Prior to Admission  Medication Sig Dispense Refill  . amphetamine-dextroamphetamine (ADDERALL XR) 30 MG 24 hr capsule Take 30 mg by mouth daily.    . sertraline (ZOLOFT) 100 MG tablet Take 100 mg by mouth daily.    . nitrofurantoin, macrocrystal-monohydrate, (MACROBID) 100 MG capsule Take 1 capsule (100 mg total) by mouth 2 (two) times daily. 10 capsule 0    Results for orders placed or performed during the hospital encounter of 04/22/18 (from the past 48 hour(s))  Comprehensive metabolic panel     Status: Abnormal   Collection Time: 04/22/18 10:19 AM  Result Value Ref Range   Sodium 136 135 - 145 mmol/L   Potassium 4.4 3.5 - 5.1 mmol/L   Chloride 101 98 - 111 mmol/L   CO2 27 22 - 32 mmol/L   Glucose, Bld 139 (H) 70 - 99 mg/dL   BUN 12 6 - 20 mg/dL   Creatinine, Ser 0.74 0.44 - 1.00 mg/dL   Calcium 9.4 8.9 - 10.3 mg/dL   Total Protein 7.8 6.5 - 8.1 g/dL   Albumin 4.0 3.5 - 5.0 g/dL   AST 19 15 - 41 U/L   ALT 16 0 - 44 U/L   Alkaline Phosphatase 72 38 - 126 U/L   Total Bilirubin 0.9 0.3 - 1.2 mg/dL   GFR calc non Af Amer >60 >60 mL/min   GFR calc Af Amer >60 >60 mL/min   Anion gap 8 5 - 15    Comment: Performed at [facility], [address] [place], [place] [address]  CBC with Differential     Status: Abnormal   Collection Time: 04/22/18 10:19 AM  Result Value Ref Range   WBC 11.5 (H) 4.0 - 10.5 K/uL   RBC 4.57 3.87 - 5.11 MIL/uL   Hemoglobin 13.2 12.0 - 15.0 g/dL   HCT 40.8 36.0 - 46.0 %   MCV 89.3 80.0 - 100.0 fL   MCH 28.9 26.0 - 34.0 pg   MCHC 32.4 30.0 - 36.0 g/dL   RDW 12.8 11.5 - 15.5 %   Platelets 263 150 - 400 K/uL   nRBC 0.0 0.0 - 0.2 %   Neutrophils Relative % 85 %   Neutro Abs 9.8 (H) 1.7 - 7.7 K/uL   Lymphocytes Relative 11 %   Lymphs Abs 1.3 0.7 - 4.0 K/uL   Monocytes Relative 4 %   Monocytes Absolute 0.4 0.1 - 1.0 K/uL   Eosinophils Relative 0 %   Eosinophils Absolute 0.0 0.0 - 0.5 K/uL   Basophils Relative 0 %   Basophils Absolute 0.0 0.0 - 0.1 K/uL  Immature Granulocytes 0 %   Abs Immature Granulocytes 0.03 0.00 - 0.07 K/uL    Comment: Performed at [facility], [address] [place], [place] [address]   Ct Abdomen Pelvis W Contrast  Result Date: 04/22/2018 CLINICAL DATA:  Left lower quadrant abdominal pain, nausea/vomiting EXAM: CT ABDOMEN AND PELVIS WITH CONTRAST TECHNIQUE: Multidetector CT imaging of the abdomen and pelvis was performed using the standard protocol following bolus administration of intravenous contrast. CONTRAST:  127mL OMNIPAQUE IOHEXOL 300 MG/ML  SOLN COMPARISON:  None. FINDINGS: Lower chest: Lung bases are clear. Hepatobiliary: Liver is within normal limits. Gallbladder is unremarkable. No intrahepatic or extrahepatic ductal dilatation. Pancreas: Within normal limits. Spleen: Within normal limits. Adrenals/Urinary Tract: Adrenal glands are within normal limits. Kidneys are within normal limits.  No hydronephrosis. Bladder is within normal limits. Stomach/Bowel: Stomach is within normal limits. No evidence of bowel obstruction. Dilated distal appendix, measuring up to 14 mm (series 2/image 37). Proximal appendicolith (series 2/image 42), with  adjacent inflammatory changes along the mid appendix (series 2/image 39). These findings are compatible with uncomplicated acute appendicitis. No evidence of perforation. No drainable fluid collection/abscess. Mild left colonic diverticulosis, without evidence of diverticulitis. Vascular/Lymphatic: No evidence of abdominal aortic aneurysm. No suspicious abdominopelvic lymphadenopathy. Reproductive: Uterus is within normal limits. No adnexal masses. Other: No abdominopelvic ascites. Musculoskeletal: Degenerative changes of the visualized thoracolumbar spine. IMPRESSION: Acute appendicitis, as described above. Proximal appendicolith. No evidence of perforation. Electronically Signed   By: Julian Hy M.D.   On: 04/22/2018 11:57    Review of Systems  Constitutional: Negative.   HENT: Negative.   Eyes: Negative.   Respiratory: Negative.   Cardiovascular: Negative.   Gastrointestinal: Positive for abdominal pain, nausea and vomiting.  Genitourinary: Negative.   Musculoskeletal: Negative.   Skin: Negative.   Neurological: Negative.   Endo/Heme/Allergies: Negative.   Psychiatric/Behavioral: Negative.     Blood pressure (!) 150/75, pulse 60, temperature 99.3 F (37.4 C), temperature source Oral, resp. rate 17, height 5\' 5"  (1.651 m), weight 124.7 kg, last menstrual period 03/24/2018, SpO2 100 %. Physical Exam  Constitutional: She is oriented to person, place, and time. She appears well-developed and well-nourished. No distress.  HENT:  Head: Normocephalic and atraumatic.  Mouth/Throat: No oropharyngeal exudate.  Eyes: Pupils are equal, round, and reactive to light. Conjunctivae and EOM are normal.  Neck: Normal range of motion. Neck supple.  Cardiovascular: Normal rate, regular rhythm and normal heart sounds.  Respiratory: Effort normal and breath sounds normal. No stridor. No respiratory distress.  GI: Soft. Bowel sounds are normal. There is abdominal tenderness.  Musculoskeletal: Normal  range of motion.        General: No tenderness or edema.  Neurological: She is alert and oriented to person, place, and time. Coordination normal.  Skin: Skin is warm and dry. No rash noted.  Psychiatric: She has a normal mood and affect. Her behavior is normal. Thought content normal.     Assessment/Plan The patient appears to have acute appendicitis. Because of the risk of perforation and sepsis I would recommend haviing the appendix removed. I have discussed with her in detail the risks and benefits of the surgery as well as some of the technical aspects and she understands and wishes to proceed  Autumn Messing III, MD 04/22/2018, 3:07 PM

## 2018-04-22 NOTE — Anesthesia Postprocedure Evaluation (Signed)
Anesthesia Post Note  Patient: Diana Woods  Procedure(s) Performed: APPENDECTOMY LAPAROSCOPIC (N/A Abdomen)     Patient location during evaluation: PACU Anesthesia Type: General Level of consciousness: awake and alert Pain management: pain level controlled Vital Signs Assessment: post-procedure vital signs reviewed and stable Respiratory status: spontaneous breathing, nonlabored ventilation, respiratory function stable and patient connected to nasal cannula oxygen Cardiovascular status: blood pressure returned to baseline and stable Postop Assessment: no apparent nausea or vomiting Anesthetic complications: no    Last Vitals:  Vitals:   04/22/18 1749 04/22/18 1846  BP: 119/60 121/63  Pulse: 70 72  Resp: 16 17  Temp: 37.6 C 36.9 C  SpO2: 99% 99%    Last Pain:  Vitals:   04/22/18 1858  TempSrc:   PainSc: 2                  Tiajuana Amass

## 2018-04-22 NOTE — Transfer of Care (Signed)
Immediate Anesthesia Transfer of Care Note  Patient: Diana Woods  Procedure(s) Performed: APPENDECTOMY LAPAROSCOPIC (N/A Abdomen)  Patient Location: PACU  Anesthesia Type:General  Level of Consciousness: awake, alert , oriented and patient cooperative  Airway & Oxygen Therapy: Patient Spontanous Breathing and Patient connected to face mask oxygen  Post-op Assessment: Report given to RN and Post -op Vital signs reviewed and stable  Post vital signs: Reviewed and stable  Last Vitals:  Vitals Value Taken Time  BP 131/89 04/22/2018  4:51 PM  Temp    Pulse 82 04/22/2018  4:58 PM  Resp 19 04/22/2018  4:58 PM  SpO2 100 % 04/22/2018  4:58 PM  Vitals shown include unvalidated device data.  Last Pain:  Vitals:   04/22/18 1405  TempSrc: Oral  PainSc:          Complications: No apparent anesthesia complications

## 2018-04-23 ENCOUNTER — Encounter (HOSPITAL_COMMUNITY): Payer: Self-pay | Admitting: General Surgery

## 2018-04-23 MED ORDER — POLYETHYLENE GLYCOL 3350 17 G PO PACK: 17.0000 g | PACK | Freq: Every day | ORAL | 0 refills | Status: AC

## 2018-04-23 MED ORDER — OXYCODONE HCL 5 MG PO TABS
5.0000 mg | ORAL_TABLET | ORAL | Status: DC | PRN
  Administered 2018-04-23: 5 mg via ORAL
  Filled 2018-04-23: qty 1

## 2018-04-23 MED ORDER — OXYCODONE HCL 5 MG PO TABS: 5.0000 mg | ORAL_TABLET | Freq: Four times a day (QID) | ORAL | 0 refills | Status: DC | PRN

## 2018-04-23 MED ORDER — DOCUSATE SODIUM 100 MG PO CAPS: 100.0000 mg | ORAL_CAPSULE | Freq: Every day | ORAL | 2 refills | Status: AC | PRN

## 2018-04-23 NOTE — Discharge Instructions (Signed)
[facility]  Please arrive at least 30 min before your appointment to complete your check in paperwork.  If you are unable to arrive 30 min prior to your appointment time we may have to cancel or reschedule you. LAPAROSCOPIC SURGERY: POST OP INSTRUCTIONS Always review your discharge instruction sheet given to you by the facility where your surgery was performed. IF YOU HAVE DISABILITY OR FAMILY LEAVE FORMS, YOU MUST BRING THEM TO THE OFFICE FOR PROCESSING.   DO NOT GIVE THEM TO YOUR DOCTOR.  PAIN CONTROL  1. First take acetaminophen (Tylenol) AND/or ibuprofen (Advil) to control your pain after surgery.  Follow directions on package.  Taking acetaminophen (Tylenol) and/or ibuprofen (Advil) regularly after surgery will help to control your pain and lower the amount of prescription pain medication you may need.  You should not take more than 4,000 mg (4 grams) of acetaminophen (Tylenol) in 24 hours.  You should not take ibuprofen (Advil), aleve, motrin, naprosyn or other NSAIDS if you have a history of stomach ulcers or chronic kidney disease.  2. A prescription for pain medication may be given to you upon discharge.  Take your pain medication as prescribed, if you still have uncontrolled pain after taking acetaminophen (Tylenol) or ibuprofen (Advil). 3. Use ice packs to help control pain. 4. If you need a refill on your pain medication, please contact your pharmacy.  They will contact our office to request authorization. Prescriptions will not be filled after 5pm or on week-ends.  HOME MEDICATIONS 5. Take your usually prescribed medications unless otherwise directed.  DIET 6. You should follow a light diet the first few days after arrival home.  Be sure to include lots of fluids daily. Avoid fatty, fried foods.   CONSTIPATION 7. It is common to experience some constipation after surgery and if you are taking pain medication.  Increasing fluid intake and taking a stool  softener (such as Colace) will usually help or prevent this problem from occurring.  A mild laxative (Milk of Magnesia or Miralax) should be taken according to package instructions if there are no bowel movements after 48 hours.  WOUND/INCISION CARE 8. Most patients will experience some swelling and bruising in the area of the incisions.  Ice packs will help.  Swelling and bruising can take several days to resolve.  9. Unless discharge instructions indicate otherwise, follow guidelines below  a. STERI-STRIPS - you may remove your outer bandages 48 hours after surgery, and you may shower at that time.  You have steri-strips (small skin tapes) in place directly over the incision.  These strips should be left on the skin for 7-10 days.   b. DERMABOND/SKIN GLUE - you may shower in 24 hours.  The glue will flake off over the next 2-3 weeks. 10. Any sutures or staples will be removed at the office during your follow-up visit.  ACTIVITIES 11. You may resume regular (light) daily activities beginning the next day--such as daily self-care, walking, climbing stairs--gradually increasing activities as tolerated.  You may have sexual intercourse when it is comfortable.  Refrain from any heavy lifting or straining until approved by your doctor. a. You may drive when you are no longer taking prescription pain medication, you can comfortably wear a seatbelt, and you can safely maneuver your car and apply brakes.  FOLLOW-UP 12. You should see your doctor in the office for a follow-up appointment approximately 2-3 weeks after your surgery.  You should have been given your post-op/follow-up appointment when   your surgery was scheduled.  If you did not receive a post-op/follow-up appointment, make sure that you call for this appointment within a day or two after you arrive home to insure a convenient appointment time.   WHEN TO CALL YOUR DOCTOR: 1. Fever over 101.0 2. Inability to urinate 3. Continued bleeding from  incision. 4. Increased pain, redness, or drainage from the incision. 5. Increasing abdominal pain  The clinic staff is available to answer your questions during regular business hours.  Please don't hesitate to call and ask to speak to one of the nurses for clinical concerns.  If you have a medical emergency, go to the nearest emergency room or call 911.  A surgeon from [facility] is always on call at the hospital. [address], [address], [place], [place]  [address] ? [address] [address], [place],    [address] [phone] ? [phone] ? FAX [phone]  .........   Managing Your Pain After Surgery Without Opioids    Thank you for participating in our program to help patients manage their pain after surgery without opioids. This is part of our effort to provide you with the best care possible, without exposing you or your family to the risk that opioids pose.  What pain can I expect after surgery? You can expect to have some pain after surgery. This is normal. The pain is typically worse the day after surgery, and quickly begins to get better. Many studies have found that many patients are able to manage their pain after surgery with Over-the-Counter (OTC) medications such as Tylenol and Motrin. If you have a condition that does not allow you to take Tylenol or Motrin, notify your surgical team.  How will I manage my pain? The best strategy for controlling your pain after surgery is around the clock pain control with Tylenol (acetaminophen) and Motrin (ibuprofen or Advil). Alternating these medications with each other allows you to maximize your pain control. In addition to Tylenol and Motrin, you can use heating pads or ice packs on your incisions to help reduce your pain.  How will I alternate your regular strength over-the-counter pain medication? You will take a dose of pain medication every three hours. ; Start by taking 650 mg of Tylenol (2 pills of 325  mg) ; 3 hours later take 600 mg of Motrin (3 pills of 200 mg) ; 3 hours after taking the Motrin take 650 mg of Tylenol ; 3 hours after that take 600 mg of Motrin.   - 1 -  See example - if your first dose of Tylenol is at 12:00 PM   12:00 PM Tylenol 650 mg (2 pills of 325 mg)  3:00 PM Motrin 600 mg (3 pills of 200 mg)  6:00 PM Tylenol 650 mg (2 pills of 325 mg)  9:00 PM Motrin 600 mg (3 pills of 200 mg)  Continue alternating every 3 hours   We recommend that you follow this schedule around-the-clock for at least 3 days after surgery, or until you feel that it is no longer needed. Use the table on the last page of this handout to keep track of the medications you are taking. Important: Do not take more than 3000mg of Tylenol or 3200mg of Motrin in a 24-hour period. Do not take ibuprofen/Motrin if you have a history of bleeding stomach ulcers, severe kidney disease, &/or actively taking a blood thinner  What if I still have pain? If you have pain that is not   controlled with the over-the-counter pain medications (Tylenol and Motrin or Advil) you might have what we call "breakthrough" pain. You will receive a prescription for a small amount of an opioid pain medication such as Oxycodone, Tramadol, or Tylenol with Codeine. Use these opioid pills in the first 24 hours after surgery if you have breakthrough pain. Do not take more than 1 pill every 4-6 hours.  If you still have uncontrolled pain after using all opioid pills, don't hesitate to call our staff using the number provided. We will help make sure you are managing your pain in the best way possible, and if necessary, we can provide a prescription for additional pain medication.   Day 1    Time  Name of Medication Number of pills taken  Amount of Acetaminophen  Pain Level   Comments  AM PM       AM PM       AM PM       AM PM       AM PM       AM PM       AM PM       AM PM       Total Daily amount of Acetaminophen Do not  take more than  3,000 mg per day      Day 2    Time  Name of Medication Number of pills taken  Amount of Acetaminophen  Pain Level   Comments  AM PM       AM PM       AM PM       AM PM       AM PM       AM PM       AM PM       AM PM       Total Daily amount of Acetaminophen Do not take more than  3,000 mg per day      Day 3    Time  Name of Medication Number of pills taken  Amount of Acetaminophen  Pain Level   Comments  AM PM       AM PM       AM PM       AM PM          AM PM       AM PM       AM PM       AM PM       Total Daily amount of Acetaminophen Do not take more than  3,000 mg per day      Day 4    Time  Name of Medication Number of pills taken  Amount of Acetaminophen  Pain Level   Comments  AM PM       AM PM       AM PM       AM PM       AM PM       AM PM       AM PM       AM PM       Total Daily amount of Acetaminophen Do not take more than  3,000 mg per day      Day 5    Time  Name of Medication Number of pills taken  Amount of Acetaminophen  Pain Level   Comments  AM PM       AM PM       AM   PM       AM PM       AM PM       AM PM       AM PM       AM PM       Total Daily amount of Acetaminophen Do not take more than  3,000 mg per day       Day 6    Time  Name of Medication Number of pills taken  Amount of Acetaminophen  Pain Level  Comments  AM PM       AM PM       AM PM       AM PM       AM PM       AM PM       AM PM       AM PM       Total Daily amount of Acetaminophen Do not take more than  3,000 mg per day      Day 7    Time  Name of Medication Number of pills taken  Amount of Acetaminophen  Pain Level   Comments  AM PM       AM PM       AM PM       AM PM       AM PM       AM PM       AM PM       AM PM       Total Daily amount of Acetaminophen Do not take more than  3,000 mg per day        For additional information about how and where to safely dispose of unused  opioid medications - https://www.morepowerfulnc.org  Disclaimer: This document contains information and/or instructional materials adapted from [facility] for the typical patient with your condition. It does not replace medical advice from your health care provider because your experience may differ from that of the typical patient. Talk to your health care provider if you have any questions about this document, your condition or your treatment plan. Adapted from [facility]   

## 2018-04-23 NOTE — Discharge Summary (Addendum)
    Patient ID: Diana Woods [id number] 1965/10/06 53 y.o.  Admit date: 04/22/2018 Discharge date: 04/23/2018  Admitting Diagnosis: Acute Appendicitis   Discharge Diagnosis Patient Active Problem List   Diagnosis Date Noted  . Appendicitis 04/22/2018    Consultants None  Procedures Laparoscopic Appendectomy, Dr. Autumn Messing III, 04/23/2018  Hospital Course:  Patient presented to [facility] on 3/29 with abdominal pain, N/V. A CT was obtained consistent with acute appendicitis. The patient was admitted and underwent a laparoscopic appendectomy.  The patient tolerated the procedure well.    On POD 1, the patient was tolerating a soft diet, voiding well, mobilizing, and pain was controlled with oral pain medications.  The patient was stable for DC home at this time with appropriate follow up made. 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   Physical Exam: Gen: Awake and alert, NAD Heart: RRR Lungs: CTA b/l Abd: Soft, ND, incisions c/d/i with dermabond overlying. Appropriately tender near incisions, otherwise NT. +BS.   Allergies as of 04/23/2018   No Known Allergies     Medication List    STOP taking these medications   nitrofurantoin (macrocrystal-monohydrate) 100 MG capsule Commonly known as:  MACROBID     TAKE these medications   amphetamine-dextroamphetamine 30 MG 24 hr capsule Commonly known as:  ADDERALL XR Take 30 mg by mouth daily.   Biotin 1 MG Caps Take 1 mg by mouth daily.   busPIRone 5 MG tablet Commonly known as:  BUSPAR Take 5 mg by mouth daily.   cholecalciferol 25 MCG (1000 UT) tablet Commonly known as:  VITAMIN D3 Take 1,000 Units by mouth daily.   docusate sodium 100 MG capsule Commonly known as:  Colace Take 1 capsule (100 mg total) by mouth daily as needed.   oxyCODONE 5 MG immediate release tablet Commonly known as:  Oxy IR/ROXICODONE Take 1 tablet (5 mg total) by mouth every 6 (six) hours as needed for  moderate pain.   polyethylene glycol packet Commonly known as:  MiraLax Take 17 g by mouth daily.   sertraline 100 MG tablet Commonly known as:  ZOLOFT Take 100 mg by mouth daily.        Follow-up Information    [facility] Follow up on 04/23/2018.   Specialty:  General Surgery Why:  05/08/2018 @ 10am. The PA will call you at the time of your appointment. Please email a picture of your incisions and name 2-3 days before your appointment to [email or web address] Contact information: [place] [address] [phone]           Signed: Alferd Apa, [facility] 04/23/2018, 9:52 AM Pager: [phone]  [Corona virus days] Agree with above.  Her sister, [name], is her contact.  [name], MD, [facility] Pager: [phone] Office phone:  [phone]

## 2019-10-16 IMAGING — CT CT ABDOMEN AND PELVIS WITH CONTRAST
2 of 5 series · 16 of 46 positions shown, 18 images · IV contrast (APPLIED)
Comparison: None.

CLINICAL DATA: Left lower quadrant abdominal pain, nausea/vomiting

EXAM:
CT ABDOMEN AND PELVIS WITH CONTRAST
TECHNIQUE: Multidetector CT imaging of the abdomen and pelvis was performed
using the standard protocol following bolus administration of
intravenous contrast.
CONTRAST:  100mL OMNIPAQUE IOHEXOL 300 MG/ML  SOLN

[Series 2: axial st · axial · 0.98mm/px · z∈[-446,+24]mm · 13 of 104 slices shown, 15 images]
[im 5/104  soft-tissue]
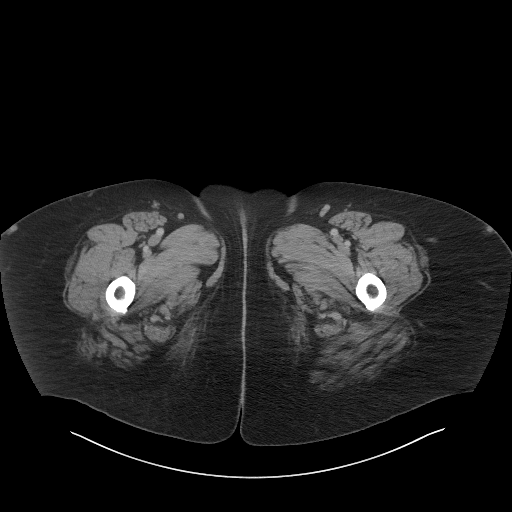
[im 5/104  bone]
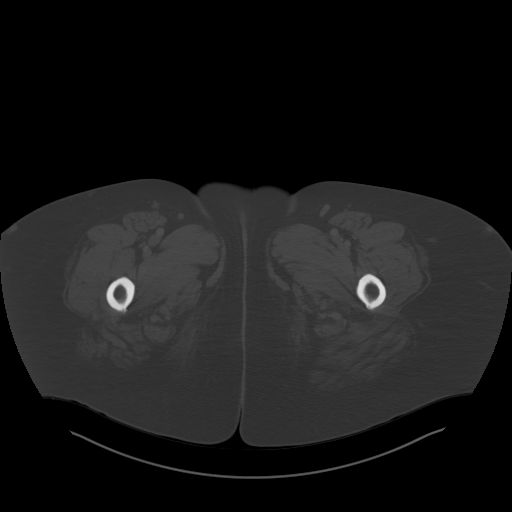
[im 15/104  soft-tissue]
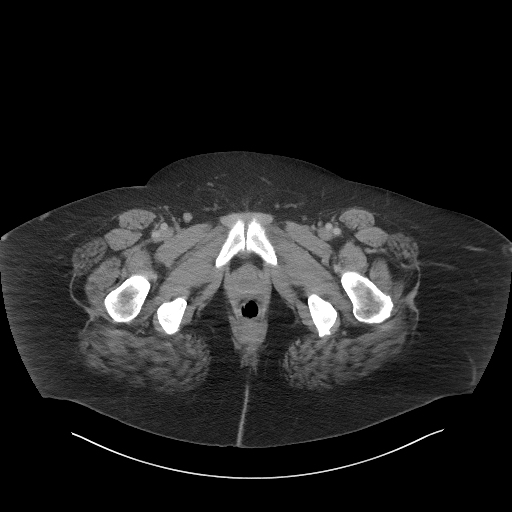
[im 24/104  soft-tissue]
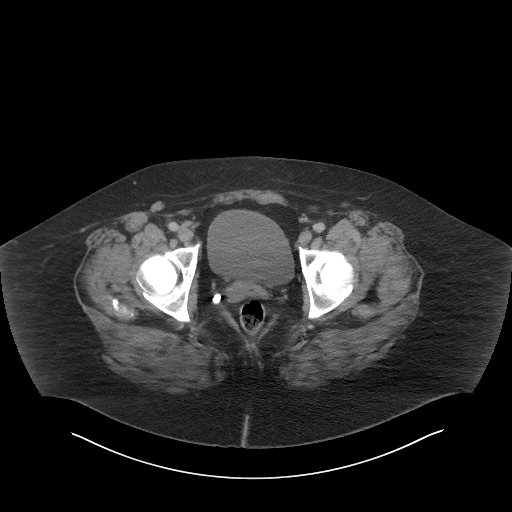
[im 29/104  soft-tissue]
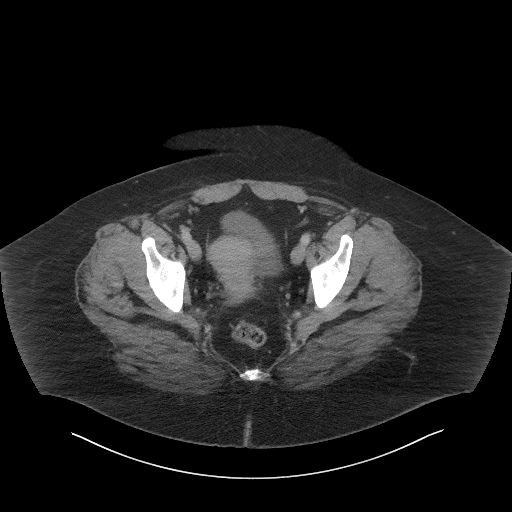
[im 38/104  soft-tissue]
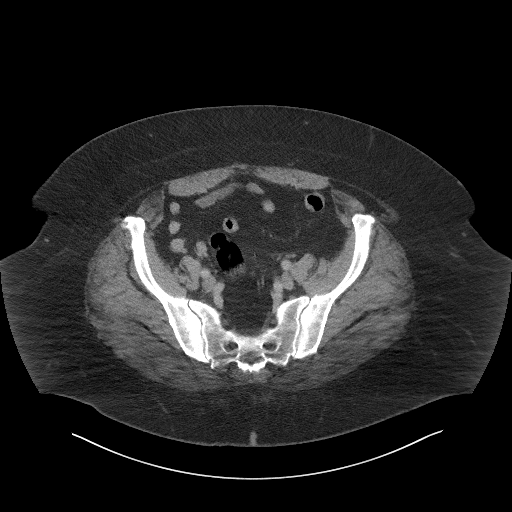
[im 43/104  soft-tissue]
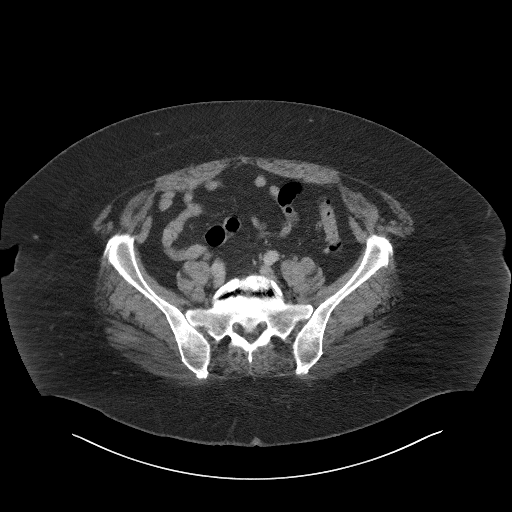
[im 52/104  soft-tissue]
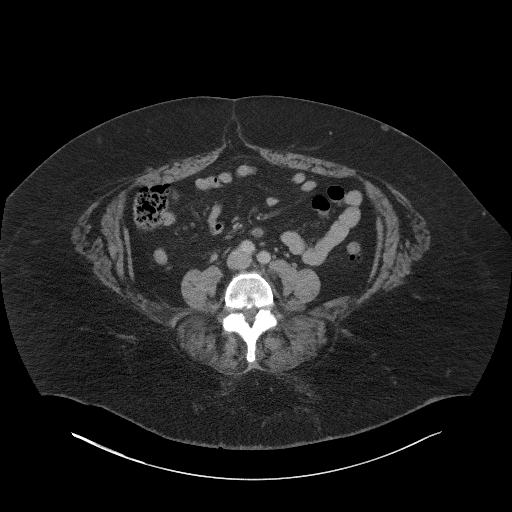
[im 61/104  soft-tissue]
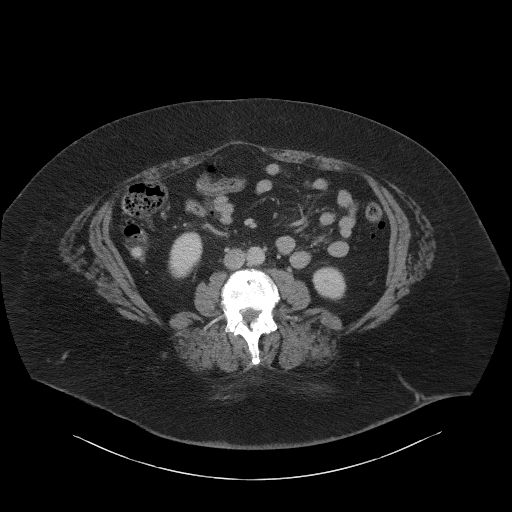
[im 66/104  soft-tissue]
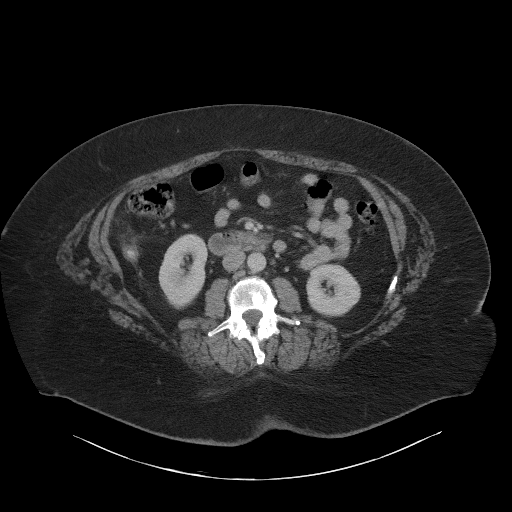
[im 66/104  bone]
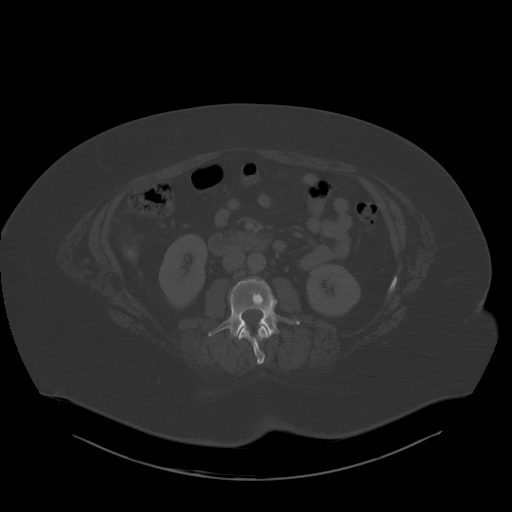
[im 75/104  soft-tissue]
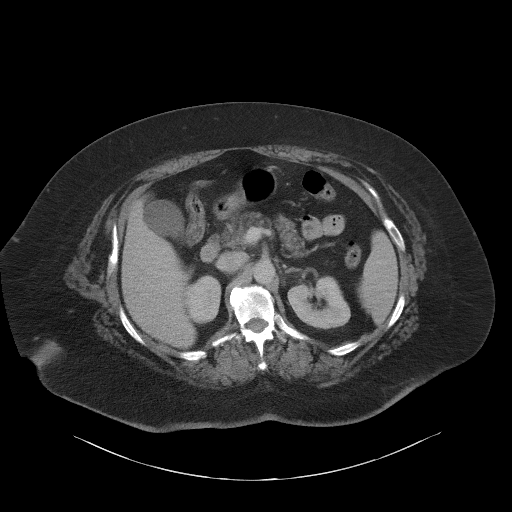
[im 80/104  soft-tissue]
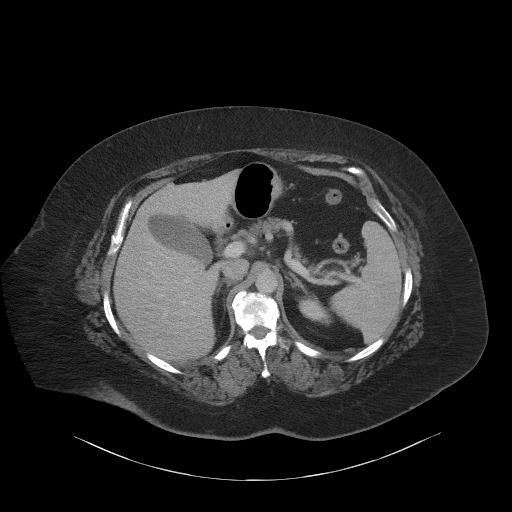
[im 89/104  soft-tissue]
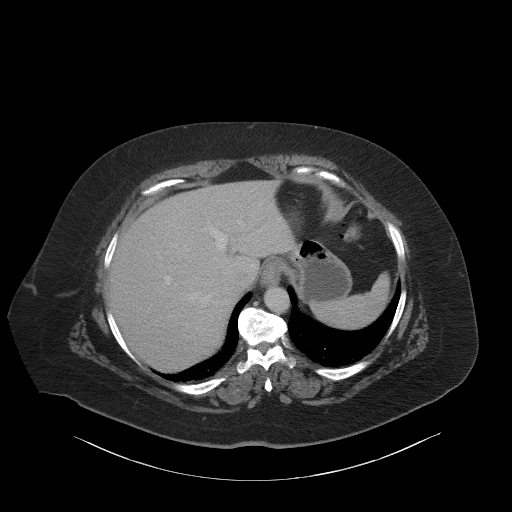
[im 99/104  soft-tissue]
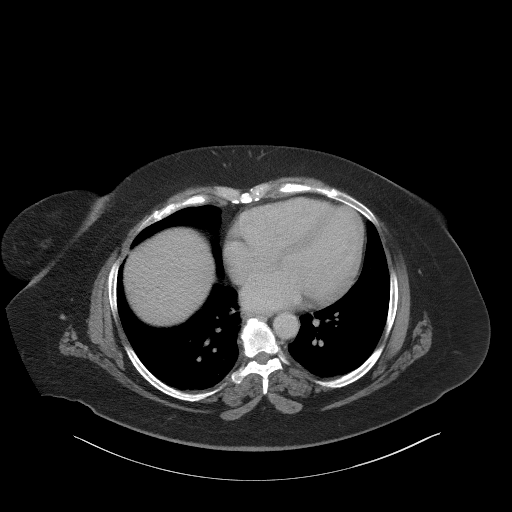

[Series 5: coronal st · coronal · 0.97mm/px · 3 of 101 slices shown]
[im 34/101  soft-tissue]
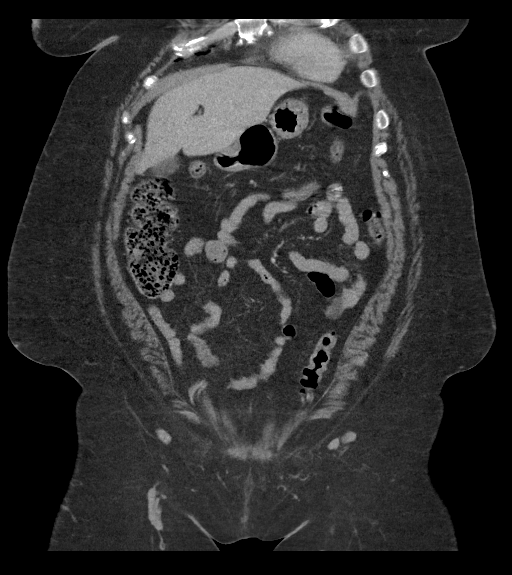
[im 45/101  soft-tissue]
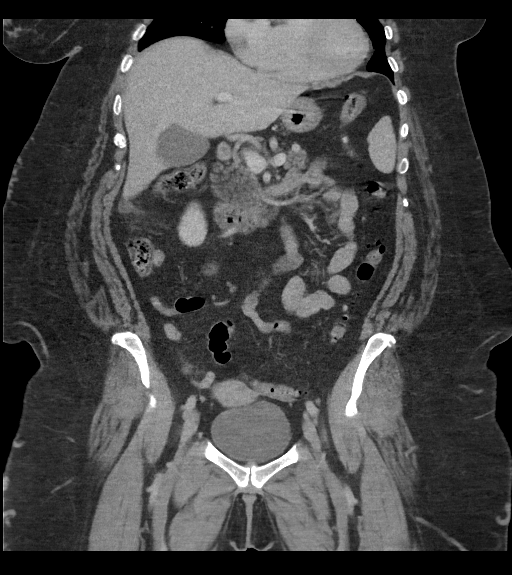
[im 56/101  soft-tissue]
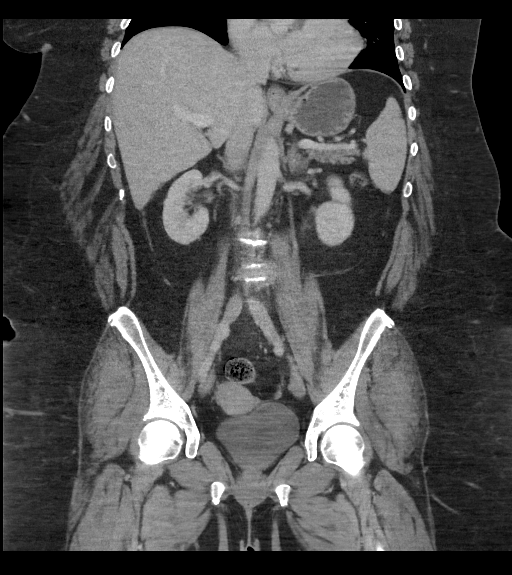

[16 of 46 positions shown; findings below may reference images not displayed]

FINDINGS: Lower chest: Lung bases are clear.

Hepatobiliary: Liver is within normal limits.

Gallbladder is unremarkable. No intrahepatic or extrahepatic ductal
dilatation.

Pancreas: Within normal limits.

Spleen: Within normal limits.

Adrenals/Urinary Tract: Adrenal glands are within normal limits.

Kidneys are within normal limits.  No hydronephrosis.

Bladder is within normal limits.

Stomach/Bowel: Stomach is within normal limits.

No evidence of bowel obstruction.

Dilated distal appendix, measuring up to 14 mm (series 2/image 37).
Proximal appendicolith (series 2/image 42), with adjacent
inflammatory changes along the mid appendix (series 2/image 39).
These findings are compatible with uncomplicated acute appendicitis.
No evidence of perforation. No drainable fluid collection/abscess.

Mild left colonic diverticulosis, without evidence of
diverticulitis.

Vascular/Lymphatic: No evidence of abdominal aortic aneurysm.

No suspicious abdominopelvic lymphadenopathy.

Reproductive: Uterus is within normal limits.

No adnexal masses.

Other: No abdominopelvic ascites.

Musculoskeletal: Degenerative changes of the visualized
thoracolumbar spine.
IMPRESSION: Acute appendicitis, as described above. Proximal appendicolith. No
evidence of perforation.

## 2021-11-13 ENCOUNTER — Other Ambulatory Visit: Payer: Self-pay

## 2021-11-13 ENCOUNTER — Emergency Department (HOSPITAL_COMMUNITY)
Admission: EM | Admit: 2021-11-13 | Discharge: 2021-11-13 | Disposition: A | Discharge status: Home / Self Care | Payer: Medicaid Other | Attending: Emergency Medicine | Admitting: Emergency Medicine

## 2021-11-13 ENCOUNTER — Encounter (HOSPITAL_COMMUNITY): Payer: Self-pay

## 2021-11-13 ENCOUNTER — Emergency Department (HOSPITAL_COMMUNITY): Payer: Medicaid Other

## 2021-11-13 DIAGNOSIS — D72829 Elevated white blood cell count, unspecified: Secondary | ICD-10-CM | POA: Diagnosis not present

## 2021-11-13 DIAGNOSIS — R001 Bradycardia, unspecified: Secondary | ICD-10-CM | POA: Diagnosis not present

## 2021-11-13 DIAGNOSIS — N23 Unspecified renal colic: Secondary | ICD-10-CM | POA: Diagnosis not present

## 2021-11-13 DIAGNOSIS — R1032 Left lower quadrant pain: Secondary | ICD-10-CM | POA: Diagnosis present

## 2021-11-13 LAB — CBC WITH DIFFERENTIAL/PLATELET
Abs Immature Granulocytes: 0.05 10*3/uL (ref 0.00–0.07)
Basophils Absolute: 0 10*3/uL (ref 0.0–0.1)
Basophils Relative: 0 %
Eosinophils Absolute: 0.1 10*3/uL (ref 0.0–0.5)
Eosinophils Relative: 1 %
HCT: 37.8 % (ref 36.0–46.0)
Hemoglobin: 12.8 g/dL (ref 12.0–15.0)
Immature Granulocytes: 0 %
Lymphocytes Relative: 13 %
Lymphs Abs: 1.6 10*3/uL (ref 0.7–4.0)
MCH: 29.8 pg (ref 26.0–34.0)
MCHC: 33.9 g/dL (ref 30.0–36.0)
MCV: 88.1 fL (ref 80.0–100.0)
Monocytes Absolute: 0.5 10*3/uL (ref 0.1–1.0)
Monocytes Relative: 4 %
Neutro Abs: 10.4 10*3/uL — ABNORMAL HIGH (ref 1.7–7.7)
Neutrophils Relative %: 82 %
Platelets: 234 10*3/uL (ref 150–400)
RBC: 4.29 MIL/uL (ref 3.87–5.11)
RDW: 12.9 % (ref 11.5–15.5)
WBC: 12.6 10*3/uL — ABNORMAL HIGH (ref 4.0–10.5)
nRBC: 0 % (ref 0.0–0.2)

## 2021-11-13 LAB — COMPREHENSIVE METABOLIC PANEL
ALT: 15 U/L (ref 0–44)
AST: 21 U/L (ref 15–41)
Albumin: 3.6 g/dL (ref 3.5–5.0)
Alkaline Phosphatase: 61 U/L (ref 38–126)
Anion gap: 11 (ref 5–15)
BUN: 17 mg/dL (ref 6–20)
CO2: 21 mmol/L — ABNORMAL LOW (ref 22–32)
Calcium: 10 mg/dL (ref 8.9–10.3)
Chloride: 107 mmol/L (ref 98–111)
Creatinine, Ser: 0.98 mg/dL (ref 0.44–1.00)
GFR, Estimated: >60 mL/min (ref >60)
Glucose, Bld: 126 mg/dL — ABNORMAL HIGH (ref 70–99)
Potassium: 5.5 mmol/L — ABNORMAL HIGH (ref 3.5–5.1)
Sodium: 139 mmol/L (ref 135–145)
Total Bilirubin: 0.5 mg/dL (ref 0.3–1.2)
Total Protein: 7.1 g/dL (ref 6.5–8.1)

## 2021-11-13 LAB — URINALYSIS, ROUTINE W REFLEX MICROSCOPIC
Bilirubin Urine: NEGATIVE
Glucose, UA: NEGATIVE mg/dL
Ketones, ur: NEGATIVE mg/dL
Leukocytes,Ua: NEGATIVE
Nitrite: NEGATIVE
Protein, ur: 30 mg/dL — AB
RBC / HPF: >50 RBC/hpf — ABNORMAL HIGH (ref 0–5)
Specific Gravity, Urine: 1.017 (ref 1.005–1.030)
pH: 7 (ref 5.0–8.0)

## 2021-11-13 LAB — LIPASE, BLOOD: Lipase: 28 U/L (ref 11–51)

## 2021-11-13 MED ORDER — KETOROLAC TROMETHAMINE 15 MG/ML IJ SOLN
30.0000 mg | Freq: Once | INTRAMUSCULAR | Status: AC
  Administered 2021-11-13: 30 mg via INTRAMUSCULAR
  Filled 2021-11-13: qty 2

## 2021-11-13 MED ORDER — NAPROXEN 500 MG PO TABS: 500.0000 mg | ORAL_TABLET | Freq: Two times a day (BID) | ORAL | 0 refills | Status: AC

## 2021-11-13 MED ORDER — OXYCODONE-ACETAMINOPHEN 5-325 MG PO TABS
1.0000 | ORAL_TABLET | Freq: Once | ORAL | Status: AC
  Administered 2021-11-13: 1 via ORAL
  Filled 2021-11-13: qty 1

## 2021-11-13 MED ORDER — OXYCODONE-ACETAMINOPHEN 5-325 MG PO TABS
1.0000 | ORAL_TABLET | Freq: Once | ORAL | Status: DC
  Filled 2021-11-13: qty 1

## 2021-11-13 MED ORDER — OXYCODONE-ACETAMINOPHEN 5-325 MG PO TABS: 1.0000 | ORAL_TABLET | Freq: Four times a day (QID) | ORAL | 0 refills | Status: DC | PRN

## 2021-11-13 MED ORDER — ONDANSETRON 4 MG PO TBDP: 4.0000 mg | ORAL_TABLET | Freq: Three times a day (TID) | ORAL | 0 refills | Status: DC | PRN

## 2021-11-13 MED ORDER — ONDANSETRON 4 MG PO TBDP
4.0000 mg | ORAL_TABLET | Freq: Once | ORAL | Status: AC
  Administered 2021-11-13: 4 mg via ORAL
  Filled 2021-11-13: qty 1

## 2021-11-13 MED ORDER — TAMSULOSIN HCL 0.4 MG PO CAPS: 0.4000 mg | ORAL_CAPSULE | Freq: Every day | ORAL | 0 refills | Status: AC

## 2021-11-13 NOTE — ED Triage Notes (Signed)
Patient BIB [facility] from home w/ LLQ abdominal pain dizziness, nausea for about 4 hours.   Incidentally EMS found patient to have a HR in the 40s sinus brady.  BP 110/81 SpO2- 100%  CBG 141

## 2021-11-13 NOTE — Discharge Instructions (Signed)
Please read and follow all provided instructions.  Your diagnoses today include:  1. Ureteral colic     Tests performed today include: Urine test that showed blood in your urine and no infection CT scan which showed a 4 millimeter kidney stone on the left side Blood test that showed normal kidney function Vital signs. See below for your results today.   Medications prescribed:  Oxycodone - narcotic pain medication  DO NOT drive or perform any activities that require you to be awake and alert because this medicine can make you drowsy.   Zofran (ondansetron) - for nausea and vomiting  Flomax (tamsulosin) - relaxes smooth muscle to help kidney stones pass  Naproxen - anti-inflammatory pain medication Do not exceed '500mg'$  naproxen every 12 hours, take with food  You have been prescribed an anti-inflammatory medication or NSAID. Take with food. Take smallest effective dose for the shortest duration needed for your pain. Stop taking if you experience stomach pain or vomiting.   Take any prescribed medications only as directed.  Home care instructions:  Follow any educational materials contained in this packet.  Please double your fluid intake for the next several days. Strain your urine and save any stones that may pass.   BE VERY CAREFUL not to take multiple medicines containing Tylenol (also called acetaminophen). Doing so can lead to an overdose which can damage your liver and cause liver failure and possibly death.   Follow-up instructions: Please follow-up with your urologist or the urologist referral (provided on front page) in the next 1 week for further evaluation of your symptoms.  Return instructions:  If you need to return to the Emergency Department, go to [facility] and not [facility]. The urologists are located at [facility] and can better care for you at this location.  Please return to the Emergency Department if you experience worsening symptoms.   Please return if you develop fever or uncontrolled pain or vomiting. Please return if you have any other emergent concerns.  Additional Information:  Your vital signs today were: BP 125/64 (BP Location: Right Arm)   Pulse 65   Temp 97.9 F (36.6 C) (Temporal)   Resp 18   SpO2 100%  If your blood pressure (BP) was elevated above 135/85 this visit, please have this repeated by your doctor within one month. --------------

## 2021-11-13 NOTE — ED Provider Triage Note (Signed)
Emergency Medicine Provider Triage Evaluation Note  Diana Woods , a 56 y.o. female  was evaluated in triage.  Pt complains of left lower quadrant abdominal pain.  Patient states that symptoms began abruptly around 10 AM this morning.  Denies associated nausea or vomiting.  She reports no radiation of pain.  Denies fever, chills, night sweats, chest pain, shortness of breath, urinary/vaginal symptoms, change in bowel habits.  Last bowel movement was last night that was regular per patient.  Previous abdominal surgery of the appendix performed in 2020.Marland Kitchen  Review of Systems  Positive: See above Negative:   Physical Exam  BP (!) 143/69 (BP Location: Right Arm)   Pulse (!) 53   Temp 98.4 F (36.9 C) (Oral)   Resp 20   SpO2 99%  Gen:   Awake, no distress   Resp:  Normal effort  MSK:   Moves extremities without difficulty  Other:  Left lower quadrant tenderness to palpation.  No left-sided CVA tenderness.  Medical Decision Making  Medically screening exam initiated at 1:44 PM.  Appropriate orders placed.  [name] was informed that the remainder of the evaluation will be completed by another provider, this initial triage assessment does not replace that evaluation, and the importance of remaining in the ED until their evaluation is complete.     [name], [place] 11/13/21 1345

## 2021-11-13 NOTE — ED Provider Notes (Signed)
[place] EMERGENCY DEPARTMENT Provider Note   CSN: [id number] Arrival date & time: 11/13/21  1300     History  Chief Complaint  Patient presents with   Abdominal Pain    Diana Woods is a 56 y.o. female.  Patient presents to the emergency department today for evaluation of acute onset, left lower quadrant abdominal pain, around 10 AM today.  She had associated nausea but no vomiting.  No dysuria, increased frequency or urgency.  She describes the pain as stabbing, nonradiating.  She has a history of GI problems but states that this feels different.  She had a appendectomy in 2020.  No vaginal bleeding or discharge.  No chest pain or shortness of breath.  No fevers.  She was given Percocet on arrival which seemed to help her pain, but now it is returning.      Home Medications Prior to Admission medications   Medication Sig Start Date End Date Taking? Authorizing Provider  amphetamine-dextroamphetamine (ADDERALL XR) 30 MG 24 hr capsule Take 30 mg by mouth daily.    [provider]  Biotin 1 MG CAPS Take 1 mg by mouth daily.    [provider]  busPIRone (BUSPAR) 5 MG tablet Take 5 mg by mouth daily. 03/30/18   [provider]  cholecalciferol (VITAMIN D3) 25 MCG (1000 UT) tablet Take 1,000 Units by mouth daily.    [provider]  oxyCODONE (OXY IR/ROXICODONE) 5 MG immediate release tablet Take 1 tablet (5 mg total) by mouth every 6 (six) hours as needed for moderate pain. 04/23/18   Maczis, Barth Kirks, PA-C  polyethylene glycol [facility]) packet Take 17 g by mouth daily. 04/23/18   Maczis, Barth Kirks, PA-C  sertraline (ZOLOFT) 100 MG tablet Take 100 mg by mouth daily.    [provider]      Allergies    Patient has no known allergies.    Review of Systems   Review of Systems  Physical Exam Updated Vital Signs BP (!) 96/43   Pulse (!) 57   Temp 98.1 F (36.7 C)   Resp 16   SpO2 97%   Physical Exam Vitals and  nursing note reviewed.  Constitutional:      General: She is not in acute distress.    Appearance: She is well-developed.  HENT:     Head: Normocephalic and atraumatic.     Right Ear: External ear normal.     Left Ear: External ear normal.     Nose: Nose normal.  Eyes:     Conjunctiva/sclera: Conjunctivae normal.  Cardiovascular:     Rate and Rhythm: Regular rhythm. Bradycardia present.     Heart sounds: No murmur heard.    Comments: Mild bradycardia Pulmonary:     Effort: No respiratory distress.     Breath sounds: No wheezing, rhonchi or rales.  Abdominal:     Palpations: Abdomen is soft.     Tenderness: There is abdominal tenderness in the left lower quadrant. There is no guarding or rebound.  Musculoskeletal:     Cervical back: Normal range of motion and neck supple.     Right lower leg: No edema.     Left lower leg: No edema.  Skin:    General: Skin is warm and dry.     Findings: No rash.  Neurological:     General: No focal deficit present.     Mental Status: She is alert. Mental status is at baseline.  Motor: No weakness.  Psychiatric:        Mood and Affect: Mood normal.    ED Results / Procedures / Treatments   Labs (all labs ordered are listed, but only abnormal results are displayed) Labs Reviewed  COMPREHENSIVE METABOLIC PANEL - Abnormal; Notable for the following components:      Result Value   Potassium 5.5 (*)    CO2 21 (*)    Glucose, Bld 126 (*)    All other components within normal limits  CBC WITH DIFFERENTIAL/PLATELET - Abnormal; Notable for the following components:   WBC 12.6 (*)    Neutro Abs 10.4 (*)    All other components within normal limits  URINALYSIS, ROUTINE W REFLEX MICROSCOPIC - Abnormal; Notable for the following components:   APPearance CLOUDY (*)    Hgb urine dipstick MODERATE (*)    Protein, ur 30 (*)    RBC / HPF >50 (*)    Bacteria, UA RARE (*)    All other components within normal limits  LIPASE, BLOOD     EKG None  Radiology CT Renal Stone Study  Result Date: 11/13/2021 CLINICAL DATA:  Flank pain, kidney stone suspected. Left flank pain. Microhematuria. EXAM: CT ABDOMEN AND PELVIS WITHOUT CONTRAST TECHNIQUE: Multidetector CT imaging of the abdomen and pelvis was performed following the standard protocol without IV contrast. RADIATION DOSE REDUCTION: This exam was performed according to the departmental dose-optimization program which includes automated exposure control, adjustment of the mA and/or kV according to patient size and/or use of iterative reconstruction technique. COMPARISON:  04/22/2018 FINDINGS: Lower chest: Lung bases are clear. Hepatobiliary: No focal liver abnormality is seen. No gallstones, gallbladder wall thickening, or biliary dilatation. Pancreas: Unremarkable. No pancreatic ductal dilatation or surrounding inflammatory changes. Spleen: Normal in size without focal abnormality. Adrenals/Urinary Tract: No adrenal gland nodules. Several bilateral intrarenal stones are demonstrated. Largest is on the right, measuring 3 mm diameter. Left-sided hydronephrosis with a 4 mm stone in the left ureteropelvic junction. Distal left ureter is decompressed. Bladder is unremarkable. Stomach/Bowel: Small esophageal hiatal hernia. Stomach, small bowel, and colon are not abnormally distended. No wall thickening or inflammatory changes. Colonic diverticulosis without evidence of acute diverticulitis. Surgical absence of the appendix. Vascular/Lymphatic: Minimal aortic calcification.  No aneurysm. Reproductive: Uterus and bilateral adnexa are unremarkable. Other: No abdominal wall hernia or abnormality. No abdominopelvic ascites. Musculoskeletal: Degenerative changes.  No acute bony abnormalities. IMPRESSION: 1. 4 mm stone in the left ureteropelvic junction with moderate proximal obstruction. 2. Additional nonobstructing stones in both kidneys measuring up to 3 mm diameter. Electronically Signed   By:  Lucienne Capers M.D.   On: 11/13/2021 20:50    Procedures Procedures    Medications Ordered in ED Medications  oxyCODONE-acetaminophen (PERCOCET/ROXICET) 5-325 MG per tablet 1 tablet (1 tablet Oral Patient Refused/Not Given 11/13/21 2050)  oxyCODONE-acetaminophen (PERCOCET/ROXICET) 5-325 MG per tablet 1 tablet (1 tablet Oral Given 11/13/21 1356)  ondansetron (ZOFRAN-ODT) disintegrating tablet 4 mg (4 mg Oral Given 11/13/21 1356)  ketorolac (TORADOL) 15 MG/ML injection 30 mg (30 mg Intramuscular Given 11/13/21 2156)    ED Course/ Medical Decision Making/ A&P    Patient seen and examined. History obtained directly from patient. Work-up including labs, imaging, EKG ordered in triage, if performed, were reviewed.    Labs/EKG: Independently reviewed and interpreted.  This included: CBC White blood cell count 12.6 otherwise unremarkable; CMP potassium 5.5 with glucose 126 otherwise unremarkable with normal kidney function; lipase normal; UA with microscopic hematuria, 6-10 white blood  cells, not clean-catch.  Imaging: CT renal protocol ordered.  Medications/Fluids: Ordered: Oral Percocet.  Patient did well earlier with this medication.  Most recent vital signs reviewed and are as follows: BP (!) 96/43   Pulse (!) 57   Temp 98.1 F (36.7 C)   Resp 16   SpO2 97%   Initial impression: Left-sided abdominal pain, microscopic hematuria.  8:55 PM Reassessment performed. Patient appears comfortable.  Currently pain is nearly resolved.  Imaging personally visualized and interpreted including: CT abdomen pelvis, agree UPJ stone on the left.   Reviewed pertinent lab work and imaging with patient at bedside. Questions answered.   Most current vital signs reviewed and are as follows: BP (!) 124/58 (BP Location: Right Arm)   Pulse (!) 55   Temp 98.1 F (36.7 C)   Resp 18   SpO2 100%   Plan: IM Toradol, fluid challenge, recheck.  11:17 PM Reassessment performed. Patient appears well.   Currently no pain.  Reviewed pertinent lab work and imaging with patient at bedside. Questions answered.   Most current vital signs reviewed and are as follows: BP 125/64 (BP Location: Right Arm)   Pulse 65   Temp 97.9 F (36.6 C) (Temporal)   Resp 18   SpO2 100%   Plan: Discharge to home.   Prescriptions written for: Percocet, naproxen, Zofran, Flomax.  Patient counseled on use of narcotic pain medications. Counseled not to combine these medications with others containing tylenol. Urged not to drink alcohol, drive, or perform any other activities that requires focus while taking these medications. The patient verbalizes understanding and agrees with the plan.  Other home care instructions discussed: Adequate hydration with urine light yellow to clear. Urged patient to strain urine and save any stones.   ED return instructions discussed: Return with uncontrolled pain, uncontrolled vomiting, fever.  Follow-up: Urology in 5 days.                           Medical Decision Making Amount and/or Complexity of Data Reviewed Radiology: ordered.  Risk Prescription drug management.   For this patient's complaint of abdominal pain, the following conditions were considered on the differential diagnosis: gastritis/PUD, enteritis/duodenitis, appendicitis, cholelithiasis/cholecystitis, cholangitis, pancreatitis, ruptured viscus, colitis, diverticulitis, small/large bowel obstruction, proctitis, cystitis, pyelonephritis, ureteral colic, aortic dissection, aortic aneurysm. In women, ectopic pregnancy, pelvic inflammatory disease, ovarian cysts, and tubo-ovarian abscess were also considered. Atypical chest etiologies were also considered including ACS, PE, and pneumonia.   Bradycardia, mild, asymptomatic. PCP f/u as needed.   Marginally high potassium. No concern for complications here. Encouraged PCP f/u for recheck, hydration.   The patient's vital signs, pertinent lab work and imaging were  reviewed and interpreted as discussed in the ED course. Hospitalization was considered for further testing, treatments, or serial exams/observation. However as patient is well-appearing, has a stable exam, and reassuring studies today, I do not feel that they warrant admission at this time. This plan was discussed with the patient who verbalizes agreement and comfort with this plan and seems reliable and able to return to the Emergency Department with worsening or changing symptoms.          Final Clinical Impression(s) / ED Diagnoses Final diagnoses:  Ureteral colic    Rx / DC Orders ED Discharge Orders          Ordered    oxyCODONE-acetaminophen (PERCOCET/ROXICET) 5-325 MG tablet  Every 6 hours PRN  11/13/21 2227    ondansetron (ZOFRAN-ODT) 4 MG disintegrating tablet  Every 8 hours PRN        11/13/21 2227    tamsulosin (FLOMAX) 0.4 MG CAPS capsule  Daily        11/13/21 2227    naproxen (NAPROSYN) 500 MG tablet  2 times daily        11/13/21 2227              Carlisle Cater, Hershal Coria 11/13/21 2320    Blanchie Dessert, MD 11/14/21 [phone]

## 2022-03-26 ENCOUNTER — Emergency Department (HOSPITAL_BASED_OUTPATIENT_CLINIC_OR_DEPARTMENT_OTHER): Payer: Medicaid Other

## 2022-03-26 ENCOUNTER — Emergency Department (HOSPITAL_BASED_OUTPATIENT_CLINIC_OR_DEPARTMENT_OTHER)
Admission: EM | Admit: 2022-03-26 | Discharge: 2022-03-26 | Disposition: A | Discharge status: Home / Self Care | Payer: Medicaid Other | Attending: Emergency Medicine | Admitting: Emergency Medicine

## 2022-03-26 ENCOUNTER — Encounter (HOSPITAL_BASED_OUTPATIENT_CLINIC_OR_DEPARTMENT_OTHER): Payer: Self-pay

## 2022-03-26 DIAGNOSIS — N132 Hydronephrosis with renal and ureteral calculous obstruction: Secondary | ICD-10-CM | POA: Insufficient documentation

## 2022-03-26 DIAGNOSIS — R109 Unspecified abdominal pain: Secondary | ICD-10-CM | POA: Diagnosis present

## 2022-03-26 DIAGNOSIS — N23 Unspecified renal colic: Secondary | ICD-10-CM

## 2022-03-26 LAB — CBC
HCT: 38.8 % (ref 36.0–46.0)
Hemoglobin: 12.7 g/dL (ref 12.0–15.0)
MCH: 29.1 pg (ref 26.0–34.0)
MCHC: 32.7 g/dL (ref 30.0–36.0)
MCV: 88.8 fL (ref 80.0–100.0)
Platelets: 262 10*3/uL (ref 150–400)
RBC: 4.37 MIL/uL (ref 3.87–5.11)
RDW: 12.7 % (ref 11.5–15.5)
WBC: 11.8 10*3/uL — ABNORMAL HIGH (ref 4.0–10.5)
nRBC: 0 % (ref 0.0–0.2)

## 2022-03-26 LAB — BASIC METABOLIC PANEL
Anion gap: 5 (ref 5–15)
BUN: 19 mg/dL (ref 6–20)
CO2: 27 mmol/L (ref 22–32)
Calcium: 9.6 mg/dL (ref 8.9–10.3)
Chloride: 108 mmol/L (ref 98–111)
Creatinine, Ser: 1.09 mg/dL — ABNORMAL HIGH (ref 0.44–1.00)
GFR, Estimated: 59 mL/min — ABNORMAL LOW (ref >60)
Glucose, Bld: 130 mg/dL — ABNORMAL HIGH (ref 70–99)
Potassium: 4.9 mmol/L (ref 3.5–5.1)
Sodium: 140 mmol/L (ref 135–145)

## 2022-03-26 MED ORDER — ONDANSETRON HCL 4 MG/2ML IJ SOLN: 4.0000 mg | Freq: Once | INTRAMUSCULAR | Status: DC

## 2022-03-26 MED ORDER — LACTATED RINGERS IV BOLUS
500.0000 mL | Freq: Once | INTRAVENOUS | Status: AC
  Administered 2022-03-26: 500 mL via INTRAVENOUS

## 2022-03-26 MED ORDER — OXYCODONE-ACETAMINOPHEN 5-325 MG PO TABS: 1.0000 | ORAL_TABLET | Freq: Four times a day (QID) | ORAL | 0 refills | Status: AC | PRN

## 2022-03-26 MED ORDER — ONDANSETRON HCL 8 MG PO TABS: 8.0000 mg | ORAL_TABLET | Freq: Three times a day (TID) | ORAL | 0 refills | Status: AC | PRN

## 2022-03-26 MED ORDER — KETOROLAC TROMETHAMINE 15 MG/ML IJ SOLN
15.0000 mg | Freq: Once | INTRAMUSCULAR | Status: AC
  Administered 2022-03-26: 15 mg via INTRAVENOUS
  Filled 2022-03-26: qty 1

## 2022-03-26 NOTE — ED Notes (Signed)
Pt requesting additional pain medication, pain 6/10. EDP Ray notified

## 2022-03-26 NOTE — Discharge Instructions (Addendum)
You were evaluated here in the emergency department with labs and CT scan.  Your CT scan is significant for a kidney stone on the right. You are being given a prescription for pain medicine and for antinausea medicine. Please return if you have worsening pain that you are unable to control, off fever, chills, or inability to tolerate medications. Please follow-up with your doctor next week Please follow-up with urologist

## 2022-03-26 NOTE — ED Triage Notes (Signed)
C/o right sided abdominal pain radiating to right flank with nausea. Hx of kidney stones.  Given 12mg of fentanyl IV by EMS pta.

## 2022-03-30 NOTE — ED Provider Notes (Signed)
[place] EMERGENCY DEPARTMENT AT [facility] Provider Note   CSN: [id number] Arrival date & time: 03/26/22  [id number]     History  Chief Complaint  Patient presents with   Abdominal Pain    Diana Woods is a 57 y.o. female.  HPI She [age]-year-old female presents today complaining of sudden onset of right flank pain.  She has had a similar episode in the past with kidney stones.  Patient treated by EMS with fentanyl prior to arrival.     Home Medications Prior to Admission medications   Medication Sig Start Date End Date Taking? Authorizing Provider  ondansetron (ZOFRAN) 8 MG tablet Take 1 tablet (8 mg total) by mouth every 8 (eight) hours as needed for nausea or vomiting. 03/26/22  Yes Pattricia Boss, MD  amphetamine-dextroamphetamine (ADDERALL XR) 25 MG 24 hr capsule Take 25 mg by mouth daily.    [provider]  Biotin 1 MG CAPS Take 1 mg by mouth daily.    [provider]  busPIRone (BUSPAR) 5 MG tablet Take 5 mg by mouth daily. Patient not taking: Reported on 11/13/2021 03/30/18   [provider]  cholecalciferol (VITAMIN D3) 25 MCG (1000 UT) tablet Take 1,000 Units by mouth daily.    [provider]  FLUoxetine (PROZAC) 20 MG capsule Take 20 mg by mouth daily. 09/06/21   [provider]  naproxen (NAPROSYN) 500 MG tablet Take 1 tablet (500 mg total) by mouth 2 (two) times daily. 11/13/21   Carlisle Cater, PA-C  oxyCODONE-acetaminophen (PERCOCET/ROXICET) 5-325 MG tablet Take 1 tablet by mouth every 6 (six) hours as needed for severe pain. 03/26/22   Pattricia Boss, MD  polyethylene glycol [facility]) packet Take 17 g by mouth daily. Patient not taking: Reported on 11/13/2021 04/23/18   Maczis, Barth Kirks, PA-C  tamsulosin (FLOMAX) 0.4 MG CAPS capsule Take 1 capsule (0.4 mg total) by mouth daily. 11/13/21   Carlisle Cater, PA-C  topiramate (TOPAMAX) 100 MG tablet Take 100 mg by mouth 2 (two) times daily. 06/10/21   [provider]       Allergies    Patient has no known allergies.    Review of Systems   Review of Systems  Physical Exam Updated Vital Signs BP 125/63   Pulse 60   Temp 97.7 F (36.5 C) (Oral)   Resp 16   Ht 1.651 m ('5\' 5"'$ )   Wt 97.5 kg   LMP 03/24/2018   SpO2 99%   BMI 35.78 kg/m  Physical Exam Vitals and nursing note reviewed.  Constitutional:      Appearance: She is well-developed.  HENT:     Head: Normocephalic and atraumatic.     Right Ear: External ear normal.     Left Ear: External ear normal.     Nose: Nose normal.  Eyes:     Conjunctiva/sclera: Conjunctivae normal.     Pupils: Pupils are equal, round, and reactive to light.  Cardiovascular:     Rate and Rhythm: Normal rate and regular rhythm.     Heart sounds: Normal heart sounds.  Pulmonary:     Effort: Pulmonary effort is normal.     Breath sounds: Normal breath sounds.  Abdominal:     General: Bowel sounds are normal.     Palpations: Abdomen is soft.  Musculoskeletal:        General: Normal range of motion.     Cervical back: Normal range of motion and neck supple.  Skin:    General:  Skin is warm and dry.  Neurological:     General: No focal deficit present.     Mental Status: She is alert and oriented to person, place, and time. Mental status is at baseline.     Sensory: No sensory deficit.     Motor: No weakness.     Gait: Gait normal.     Deep Tendon Reflexes: Reflexes are normal and symmetric. Reflexes normal.  Psychiatric:        Behavior: Behavior normal.        Thought Content: Thought content normal.        Judgment: Judgment normal.     ED Results / Procedures / Treatments   Labs (all labs ordered are listed, but only abnormal results are displayed) Labs Reviewed  CBC - Abnormal; Notable for the following components:      Result Value   WBC 11.8 (*)    All other components within normal limits  BASIC METABOLIC PANEL - Abnormal; Notable for the following components:   Glucose, Bld 130 (*)     Creatinine, Ser 1.09 (*)    GFR, Estimated 59 (*)    All other components within normal limits    EKG None  Radiology No results found.  Procedures Procedures    Medications Ordered in ED Medications  lactated ringers bolus 500 mL ( Intravenous Stopped 03/26/22 1034)  ketorolac (TORADOL) 15 MG/ML injection 15 mg (15 mg Intravenous Given 03/26/22 1004)  lactated ringers bolus 500 mL ( Intravenous Stopped 03/26/22 1113)    ED Course/ Medical Decision Making/ A&P                             Medical Decision Making Amount and/or Complexity of Data Reviewed Labs: ordered. Radiology: ordered.  Risk Prescription drug management.   Patient evaluated here in the ED with imaging and has a 4 mm right distal ureteral stone.  She is given ketorolac with good symptom relief. She is advised regarding need for outpatient follow-up and return precautions and voices understanding.        Final Clinical Impression(s) / ED Diagnoses Final diagnoses:  Renal colic on right side    Rx / DC Orders ED Discharge Orders          Ordered    ondansetron (ZOFRAN) 8 MG tablet  Every 8 hours PRN        03/26/22 1130    oxyCODONE-acetaminophen (PERCOCET/ROXICET) 5-325 MG tablet  Every 6 hours PRN        03/26/22 1130              Pattricia Boss, MD 03/30/22 1501
